# Patient Record
Sex: Female | Born: 1970 | Race: Black or African American | Hispanic: No | Marital: Married | Smoking: Never smoker
Health system: Southern US, Community
[De-identification: ages and names within clinical notes are randomized; demographics above are authoritative.]

## PROBLEM LIST (undated history)

## (undated) DIAGNOSIS — E039 Hypothyroidism, unspecified: Secondary | ICD-10-CM

## (undated) DIAGNOSIS — E079 Disorder of thyroid, unspecified: Secondary | ICD-10-CM

## (undated) DIAGNOSIS — F32A Depression, unspecified: Secondary | ICD-10-CM

## (undated) DIAGNOSIS — I10 Essential (primary) hypertension: Secondary | ICD-10-CM

## (undated) DIAGNOSIS — F329 Major depressive disorder, single episode, unspecified: Secondary | ICD-10-CM

## (undated) DIAGNOSIS — D649 Anemia, unspecified: Secondary | ICD-10-CM

## (undated) HISTORY — DX: Hypothyroidism, unspecified: E03.9

## (undated) HISTORY — PX: OTHER SURGICAL HISTORY: SHX169

## (undated) HISTORY — PX: TUBAL LIGATION: SHX77

---

## 2012-10-09 ENCOUNTER — Emergency Department (HOSPITAL_COMMUNITY)
Admission: EM | Admit: 2012-10-09 | Discharge: 2012-10-09 | Disposition: A | Discharge status: Home / Self Care | Payer: 59 | Attending: Emergency Medicine | Admitting: Emergency Medicine

## 2012-10-09 ENCOUNTER — Encounter (HOSPITAL_COMMUNITY): Payer: Self-pay

## 2012-10-09 ENCOUNTER — Emergency Department (HOSPITAL_COMMUNITY): Payer: 59

## 2012-10-09 DIAGNOSIS — R1013 Epigastric pain: Secondary | ICD-10-CM | POA: Insufficient documentation

## 2012-10-09 DIAGNOSIS — E079 Disorder of thyroid, unspecified: Secondary | ICD-10-CM | POA: Insufficient documentation

## 2012-10-09 DIAGNOSIS — R42 Dizziness and giddiness: Secondary | ICD-10-CM | POA: Insufficient documentation

## 2012-10-09 DIAGNOSIS — R197 Diarrhea, unspecified: Secondary | ICD-10-CM | POA: Insufficient documentation

## 2012-10-09 DIAGNOSIS — Z862 Personal history of diseases of the blood and blood-forming organs and certain disorders involving the immune mechanism: Secondary | ICD-10-CM | POA: Insufficient documentation

## 2012-10-09 DIAGNOSIS — R109 Unspecified abdominal pain: Secondary | ICD-10-CM

## 2012-10-09 DIAGNOSIS — R112 Nausea with vomiting, unspecified: Secondary | ICD-10-CM | POA: Insufficient documentation

## 2012-10-09 DIAGNOSIS — K529 Noninfective gastroenteritis and colitis, unspecified: Secondary | ICD-10-CM

## 2012-10-09 DIAGNOSIS — F329 Major depressive disorder, single episode, unspecified: Secondary | ICD-10-CM | POA: Insufficient documentation

## 2012-10-09 DIAGNOSIS — F3289 Other specified depressive episodes: Secondary | ICD-10-CM | POA: Insufficient documentation

## 2012-10-09 DIAGNOSIS — K5289 Other specified noninfective gastroenteritis and colitis: Secondary | ICD-10-CM | POA: Insufficient documentation

## 2012-10-09 DIAGNOSIS — Z79899 Other long term (current) drug therapy: Secondary | ICD-10-CM | POA: Insufficient documentation

## 2012-10-09 DIAGNOSIS — K429 Umbilical hernia without obstruction or gangrene: Secondary | ICD-10-CM | POA: Insufficient documentation

## 2012-10-09 DIAGNOSIS — R61 Generalized hyperhidrosis: Secondary | ICD-10-CM | POA: Insufficient documentation

## 2012-10-09 DIAGNOSIS — I10 Essential (primary) hypertension: Secondary | ICD-10-CM | POA: Insufficient documentation

## 2012-10-09 HISTORY — DX: Depression, unspecified: F32.A

## 2012-10-09 HISTORY — DX: Essential (primary) hypertension: I10

## 2012-10-09 HISTORY — DX: Major depressive disorder, single episode, unspecified: F32.9

## 2012-10-09 HISTORY — DX: Disorder of thyroid, unspecified: E07.9

## 2012-10-09 HISTORY — DX: Anemia, unspecified: D64.9

## 2012-10-09 LAB — HEPATIC FUNCTION PANEL
ALT: 15 U/L (ref 0–35)
AST: 19 U/L (ref 0–37)
Bilirubin, Direct: 0.1 mg/dL (ref 0.0–0.3)

## 2012-10-09 LAB — BASIC METABOLIC PANEL
BUN: 12 mg/dL (ref 6–23)
Chloride: 98 mEq/L (ref 96–112)
GFR calc Af Amer: >90 mL/min (ref >90)
Glucose, Bld: 113 mg/dL — ABNORMAL HIGH (ref 70–99)
Potassium: 4.3 mEq/L (ref 3.5–5.1)

## 2012-10-09 LAB — CBC WITH DIFFERENTIAL/PLATELET
Basophils Relative: 0 % (ref 0–1)
Hemoglobin: 14.1 g/dL (ref 12.0–15.0)
Lymphs Abs: 0.6 10*3/uL — ABNORMAL LOW (ref 0.7–4.0)
Monocytes Relative: 3 % (ref 3–12)
Neutro Abs: 7.3 10*3/uL (ref 1.7–7.7)
Neutrophils Relative %: 89 % — ABNORMAL HIGH (ref 43–77)
RBC: 5.17 MIL/uL — ABNORMAL HIGH (ref 3.87–5.11)

## 2012-10-09 LAB — URINALYSIS, ROUTINE W REFLEX MICROSCOPIC
Glucose, UA: NEGATIVE mg/dL
Hgb urine dipstick: NEGATIVE
Specific Gravity, Urine: 1.02 (ref 1.005–1.030)

## 2012-10-09 LAB — URINE MICROSCOPIC-ADD ON

## 2012-10-09 MED ORDER — PROMETHAZINE HCL 25 MG PO TABS: 25.0000 mg | ORAL_TABLET | Freq: Four times a day (QID) | ORAL | Status: DC | PRN

## 2012-10-09 MED ORDER — IOHEXOL 300 MG/ML  SOLN
100.0000 mL | Freq: Once | INTRAMUSCULAR | Status: AC | PRN
  Administered 2012-10-09: 100 mL via INTRAVENOUS

## 2012-10-09 MED ORDER — SODIUM CHLORIDE 0.9 % IV BOLUS (SEPSIS)
1000.0000 mL | Freq: Once | INTRAVENOUS | Status: AC
  Administered 2012-10-09: 1000 mL via INTRAVENOUS

## 2012-10-09 MED ORDER — HYDROMORPHONE HCL PF 1 MG/ML IJ SOLN
1.0000 mg | Freq: Once | INTRAMUSCULAR | Status: AC
  Administered 2012-10-09: 1 mg via INTRAVENOUS
  Filled 2012-10-09: qty 1

## 2012-10-09 MED ORDER — HYDROCODONE-ACETAMINOPHEN 5-325 MG PO TABS: 1.0000 | ORAL_TABLET | Freq: Four times a day (QID) | ORAL | Status: DC | PRN

## 2012-10-09 MED ORDER — SODIUM CHLORIDE 0.9 % IV SOLN
INTRAVENOUS | Status: DC
  Administered 2012-10-09: 09:00:00 via INTRAVENOUS

## 2012-10-09 MED ORDER — ONDANSETRON HCL 4 MG/2ML IJ SOLN
4.0000 mg | Freq: Once | INTRAMUSCULAR | Status: AC
Start: 2012-10-09 — End: 2012-10-09
  Administered 2012-10-09: 4 mg via INTRAVENOUS
  Filled 2012-10-09: qty 2

## 2012-10-09 MED ORDER — IOHEXOL 300 MG/ML  SOLN
50.0000 mL | Freq: Once | INTRAMUSCULAR | Status: AC | PRN
  Administered 2012-10-09: 50 mL via ORAL

## 2012-10-09 NOTE — ED Notes (Signed)
Complain of n/v/d since last night 

## 2012-10-09 NOTE — ED Provider Notes (Signed)
History    This chart was scribed for Karen Jakes, MD by Quintella Reichert, ED scribe.  This patient was seen in room [facility]03/[facility]03 and the patient's care was started at 8:36 AM .   CSN: [id number]  Arrival date & time 10/09/12  0759      Chief Complaint  Patient presents with  . Abdominal Pain     Patient is a 42 y.o. female presenting with abdominal pain. The history is provided by the patient. No language interpreter was used.  Abdominal Pain Pain location:  Periumbilical Pain quality: sharp   Pain radiates to:  L flank, R flank and groin Pain severity:  Severe Onset quality:  Sudden Duration:  10 hours Timing:  Constant Progression:  Unable to specify Chronicity:  New Relieved by:  None tried Worsened by:  Nothing tried Ineffective treatments:  None tried Associated symptoms: diarrhea, nausea and vomiting (no hematemesis)   Associated symptoms: no chest pain, no chills, no cough, no dysuria, no fever and no sore throat     HPI Comments: Karen Cruz is a 42 y.o. female who presents to the Emergency Department complaining of severe, constant, sudden-onset lower abdominal pain that radiates into both flanks and down into groin.  Symptoms began at 11 PM last night.  Pain is described as "sharp" and worst in periumbilical region. Patient rates pain 8/10 presently and 10/10 at worst.  Pt also reports nausea, intermittent emesis, mild diarrhea, lightheadedness, mild dizziness, and diaphoresis.  She has history of intermittent lower abdominal pain associated with menstrual periods, but she describes current pain as disinct from past episodes.  Pt denies back pain, dysuria, fever, chills, cough, rhinorrhea, sore throat, CP, SOB, hematemesis, swelling in legs, rash, neck pain, headache.  She denies sick contact. Pt has had treatment for an umbilical hernia, and has no h/o abdominal surgery.     Past Medical History  Diagnosis Date  . Anemia   . Hypertension   . Thyroid  disease   . Depression     Past Surgical History  Procedure Laterality Date  . Tubal ligation      No family history on file.  History  Substance Use Topics  . Smoking status: Not on file  . Smokeless tobacco: Not on file  . Alcohol Use: No    OB History   Grav Para Term Preterm Abortions TAB SAB Ect Mult Living                  Review of Systems  Constitutional: Positive for diaphoresis. Negative for fever and chills.  HENT: Negative for sore throat, rhinorrhea and neck pain.   Respiratory: Negative for cough.   Cardiovascular: Negative for chest pain and leg swelling.  Gastrointestinal: Positive for nausea, vomiting (no hematemesis), abdominal pain and diarrhea.  Genitourinary: Negative for dysuria.  Musculoskeletal: Negative for back pain.  Skin: Negative for rash.  Neurological: Positive for dizziness and light-headedness. Negative for headaches.  Hematological: Does not bruise/bleed easily.     Allergies  Review of patient's allergies indicates no known allergies.  Home Medications   Current Outpatient Rx  Name  Route  Sig  Dispense  Refill  . ALPRAZolam (XANAX) 0.5 MG tablet   Oral   Take 0.25 mg by mouth at bedtime as needed for sleep.         . hydrochlorothiazide (HYDRODIURIL) 25 MG tablet   Oral   Take 25 mg by mouth daily.         Marland Kitchen  Levomilnacipran HCl ER (FETZIMA) 20 MG CP24   Oral   Take 40 mg by mouth daily.         Marland Kitchen levothyroxine (SYNTHROID, LEVOTHROID) 175 MCG tablet   Oral   Take 175 mcg by mouth daily before breakfast.         . lisdexamfetamine (VYVANSE) 50 MG capsule   Oral   Take 50 mg by mouth every morning.         . nebivolol (BYSTOLIC) 10 MG tablet   Oral   Take 10 mg by mouth daily.         . Vitamin D, Ergocalciferol, (DRISDOL) 50000 UNITS CAPS   Oral   Take 50,000 Units by mouth every 7 (seven) days. Takes on Monday.         Marland Kitchen HYDROcodone-acetaminophen (NORCO/VICODIN) 5-325 MG per tablet   Oral    Take 1-2 tablets by mouth every 6 (six) hours as needed for pain.   10 tablet   0   . promethazine (PHENERGAN) 25 MG tablet   Oral   Take 1 tablet (25 mg total) by mouth every 6 (six) hours as needed for nausea.   12 tablet   0     Triage Vitals: BP 134/85  Pulse 83  Temp(Src) 97.7 F (36.5 C) (Oral)  Resp 16  Ht 5\' 1"  (1.549 m)  Wt 200 lb (90.719 kg)  BMI 37.81 kg/m2  SpO2 98%  Physical Exam  Nursing note and vitals reviewed. Constitutional: She is oriented to person, place, and time. She appears well-developed and well-nourished.  HENT:  Head: Normocephalic and atraumatic.  Eyes: Conjunctivae and EOM are normal. Pupils are equal, round, and reactive to light.  Neck: Normal range of motion. Neck supple.  Cardiovascular: Normal rate, regular rhythm and normal heart sounds.   No murmur heard. Pulmonary/Chest: Breath sounds normal. No respiratory distress. She has no wheezes. She has no rales.  Abdominal: Soft. Bowel sounds are normal. There is tenderness (Epigastric). There is no rebound and no guarding.  Musculoskeletal: Normal range of motion. She exhibits no edema.  Neurological: She is alert and oriented to person, place, and time. No cranial nerve deficit.  Skin: Skin is warm.     ED Course  Procedures (including critical care time)  DIAGNOSTIC STUDIES: Oxygen Saturation is 98% on room air, normal by my interpretation.    COORDINATION OF CARE: 8:40 AM-Discussed possibility of gastroenteritis, and treatment plan which includes anti-emetics, pain medication, IV fluids, labs and imaging with pt at bedside and pt agreed to plan.    Medications  0.9 %  sodium chloride infusion ( Intravenous New Bag/Given 10/09/12 0907)  ondansetron (ZOFRAN) injection 4 mg (4 mg Intravenous Given 10/09/12 0907)  HYDROmorphone (DILAUDID) injection 1 mg (1 mg Intravenous Given 10/09/12 0907)  sodium chloride 0.9 % bolus 1,000 mL (1,000 mLs Intravenous New Bag/Given 10/09/12 0913)   iohexol (OMNIPAQUE) 300 MG/ML solution 50 mL (50 mLs Oral Contrast Given 10/09/12 0903)  iohexol (OMNIPAQUE) 300 MG/ML solution 100 mL (100 mLs Intravenous Contrast Given 10/09/12 1107)    Labs Reviewed  URINALYSIS, ROUTINE W REFLEX MICROSCOPIC - Abnormal; Notable for the following:    Ketones, ur 40 (*)    Protein, ur TRACE (*)    All other components within normal limits  URINE MICROSCOPIC-ADD ON - Abnormal; Notable for the following:    Squamous Epithelial / LPF MANY (*)    Bacteria, UA FEW (*)    All other components within normal limits  CBC WITH DIFFERENTIAL - Abnormal; Notable for the following:    RBC 5.17 (*)    Neutrophils Relative 89 (*)    Lymphocytes Relative 8 (*)    Lymphs Abs 0.6 (*)    All other components within normal limits  BASIC METABOLIC PANEL - Abnormal; Notable for the following:    Glucose, Bld 113 (*)    All other components within normal limits  HEPATIC FUNCTION PANEL - Abnormal; Notable for the following:    Total Protein 9.1 (*)    All other components within normal limits  LIPASE, BLOOD    Ct Abdomen Pelvis W Contrast  10/09/2012  *RADIOLOGY REPORT*  Clinical Data: Sharp umbilical pain for 1 day.  History of tubal ligation.  CT ABDOMEN AND PELVIS WITH CONTRAST  Technique:  Multidetector CT imaging of the abdomen and pelvis was performed following the standard protocol during bolus administration of intravenous contrast.  Contrast: OMNIPAQUE IOHEXOL 300 MG/ML  SOLN  Comparison: None.  Findings: The lung bases are clear and there is no pleural effusion.  The liver, gallbladder, biliary system and pancreas appear normal. The spleen, adrenal glands and kidneys appear normal.  There is a small hiatal hernia.  The stomach and small bowel otherwise appear normal.  The ligament of Treitz is normally positioned.  The appendix and colon appear normal.  There is slight whirling of the small bowel mesentery on the axial images.  There is no volvulus or specific  evidence of internal hernia.  The uterus is retroverted.  There is a possible 9 mm fibroid in the left uterine fundus.  No adnexal mass is demonstrated.  There is a small amount of free pelvic fluid which is asymmetric to the right. There is no surrounding inflammatory change.  There is a supraumbilical ventral hernia which contains fat.  This measures 8.6 cm cephalocaudad.  There is an increased density along the inferior aspect of this hernia which could indicate incarceration.  There is no herniated bowel.  No other hernias are identified.  There is a convex left lumbar scoliosis without acute osseous abnormality.  IMPRESSION:  1.  Supraumbilical hernia containing fat.  There is increased density along the inferior aspect of the herniated fat which could indicate incarceration.  Correlate clinically.  There is no evidence of herniated bowel or bowel obstruction. 2.  Slight whirling appearance of the central small bowel mesentery without evidence of malrotation, volvulus or inflammation. 3.  Asymmetric free pelvic fluid on the right and possible small uterine fibroid. 4.  No evidence of appendicitis.   Original Report Authenticated By: Carey Bullocks, M.D.    Results for orders placed during the hospital encounter of 10/09/12  URINALYSIS, ROUTINE W REFLEX MICROSCOPIC      Result Value Range   Color, Urine YELLOW  YELLOW   APPearance CLEAR  CLEAR   Specific Gravity, Urine 1.020  1.005 - 1.030   pH 8.0  5.0 - 8.0   Glucose, UA NEGATIVE  NEGATIVE mg/dL   Hgb urine dipstick NEGATIVE  NEGATIVE   Bilirubin Urine NEGATIVE  NEGATIVE   Ketones, ur 40 (*) NEGATIVE mg/dL   Protein, ur TRACE (*) NEGATIVE mg/dL   Urobilinogen, UA 1.0  0.0 - 1.0 mg/dL   Nitrite NEGATIVE  NEGATIVE   Leukocytes, UA NEGATIVE  NEGATIVE  URINE MICROSCOPIC-ADD ON      Result Value Range   Squamous Epithelial / LPF MANY (*) RARE   Bacteria, UA FEW (*) RARE  CBC WITH DIFFERENTIAL  Result Value Range   WBC 8.2  4.0 - 10.5 K/uL    RBC 5.17 (*) 3.87 - 5.11 MIL/uL   Hemoglobin 14.1  12.0 - 15.0 g/dL   HCT 13.2  44.0 - 10.2 %   MCV 82.2  78.0 - 100.0 fL   MCH 27.3  26.0 - 34.0 pg   MCHC 33.2  30.0 - 36.0 g/dL   RDW 72.5  36.6 - 44.0 %   Platelets 261  150 - 400 K/uL   Neutrophils Relative 89 (*) 43 - 77 %   Neutro Abs 7.3  1.7 - 7.7 K/uL   Lymphocytes Relative 8 (*) 12 - 46 %   Lymphs Abs 0.6 (*) 0.7 - 4.0 K/uL   Monocytes Relative 3  3 - 12 %   Monocytes Absolute 0.3  0.1 - 1.0 K/uL   Eosinophils Relative 0  0 - 5 %   Eosinophils Absolute 0.0  0.0 - 0.7 K/uL   Basophils Relative 0  0 - 1 %   Basophils Absolute 0.0  0.0 - 0.1 K/uL  BASIC METABOLIC PANEL      Result Value Range   Sodium 138  135 - 145 mEq/L   Potassium 4.3  3.5 - 5.1 mEq/L   Chloride 98  96 - 112 mEq/L   CO2 29  19 - 32 mEq/L   Glucose, Bld 113 (*) 70 - 99 mg/dL   BUN 12  6 - 23 mg/dL   Creatinine, Ser 3.47  0.50 - 1.10 mg/dL   Calcium 42.5  8.4 - 95.6 mg/dL   GFR calc non Af Amer >90  >90 mL/min   GFR calc Af Amer >90  >90 mL/min  HEPATIC FUNCTION PANEL      Result Value Range   Total Protein 9.1 (*) 6.0 - 8.3 g/dL   Albumin 4.6  3.5 - 5.2 g/dL   AST 19  0 - 37 U/L   ALT 15  0 - 35 U/L   Alkaline Phosphatase 74  39 - 117 U/L   Total Bilirubin 0.7  0.3 - 1.2 mg/dL   Bilirubin, Direct 0.1  0.0 - 0.3 mg/dL   Indirect Bilirubin 0.6  0.3 - 0.9 mg/dL  LIPASE, BLOOD      Result Value Range   Lipase 16  11 - 59 U/L      1. Abdominal pain   2. Enteritis   3. Umbilical hernia       MDM   Patient with billable hernia containing some fat. No evidence of bowel incarceration. The fat could be causing the pain but is in no acute surgical intervention required for that. Other symptoms do seem to be consistent with a viral of gastroenteritis-type picture. Patient improved in the emergency department. We'll discharge on pain medicine and antinausea medicine and surgical referral for elective repair of the umbilical hernia. Patient will  return for any new or worse symptoms.    I personally performed the services described in this documentation, which was scribed in my presence. The recorded information has been reviewed and is accurate.    Karen Jakes, MD 10/09/12 [phone]

## 2012-10-09 NOTE — ED Notes (Signed)
Dr.zackowski to see pt.  

## 2012-10-09 NOTE — ED Notes (Signed)
RN at bedside

## 2012-10-09 NOTE — ED Notes (Signed)
Pt had incont. Stool on self, linens changed and pt given supplies to clean self in bathroom, walked without difficulty.

## 2012-10-09 NOTE — ED Notes (Signed)
Pt reports ab pain off/on for the last year, since having ablation.  Assisted out of bed by female friend, stated that ab pain was causing her legs to hurt.  +n/v/d that started last night. No fever. Given warm blanket at arrival, also has blanket from home.  Ambulated to bathroom without difficulty.

## 2015-03-23 ENCOUNTER — Ambulatory Visit (HOSPITAL_COMMUNITY)
Admission: RE | Admit: 2015-03-23 | Discharge: 2015-03-23 | Disposition: A | Discharge status: Home / Self Care | Payer: No Typology Code available for payment source | Source: Ambulatory Visit | Attending: Pulmonary Disease | Admitting: Pulmonary Disease

## 2015-03-23 ENCOUNTER — Other Ambulatory Visit (HOSPITAL_COMMUNITY): Payer: Self-pay | Admitting: Pulmonary Disease

## 2015-03-23 ENCOUNTER — Encounter (HOSPITAL_COMMUNITY): Payer: Self-pay

## 2015-03-23 ENCOUNTER — Encounter (HOSPITAL_COMMUNITY)
Admission: RE | Admit: 2015-03-23 | Discharge: 2015-03-23 | Disposition: A | Discharge status: Home / Self Care | Payer: No Typology Code available for payment source | Source: Ambulatory Visit | Attending: Pulmonary Disease | Admitting: Pulmonary Disease

## 2015-03-23 DIAGNOSIS — R791 Abnormal coagulation profile: Secondary | ICD-10-CM | POA: Diagnosis not present

## 2015-03-23 DIAGNOSIS — R079 Chest pain, unspecified: Secondary | ICD-10-CM | POA: Insufficient documentation

## 2015-03-23 DIAGNOSIS — R7989 Other specified abnormal findings of blood chemistry: Secondary | ICD-10-CM

## 2015-03-23 DIAGNOSIS — R0602 Shortness of breath: Secondary | ICD-10-CM | POA: Diagnosis present

## 2015-03-23 DIAGNOSIS — I2699 Other pulmonary embolism without acute cor pulmonale: Secondary | ICD-10-CM

## 2015-03-23 MED ORDER — TECHNETIUM TO 99M ALBUMIN AGGREGATED
4.0000 | Freq: Once | INTRAVENOUS | Status: AC | PRN
  Administered 2015-03-23: 3.97 via INTRAVENOUS

## 2015-03-23 MED ORDER — TECHNETIUM TC 99M DIETHYLENETRIAME-PENTAACETIC ACID
30.0000 | Freq: Once | INTRAVENOUS | Status: DC | PRN
  Administered 2015-03-23: 30.2 via RESPIRATORY_TRACT
  Filled 2015-03-23: qty 30

## 2016-07-20 ENCOUNTER — Emergency Department (HOSPITAL_COMMUNITY)
Admission: EM | Admit: 2016-07-20 | Discharge: 2016-07-21 | Disposition: A | Discharge status: Home / Self Care | Payer: BLUE CROSS/BLUE SHIELD | Attending: Emergency Medicine | Admitting: Emergency Medicine

## 2016-07-20 ENCOUNTER — Encounter (HOSPITAL_COMMUNITY): Payer: Self-pay

## 2016-07-20 ENCOUNTER — Emergency Department (HOSPITAL_COMMUNITY): Payer: BLUE CROSS/BLUE SHIELD

## 2016-07-20 DIAGNOSIS — R079 Chest pain, unspecified: Secondary | ICD-10-CM | POA: Diagnosis not present

## 2016-07-20 DIAGNOSIS — Z79899 Other long term (current) drug therapy: Secondary | ICD-10-CM | POA: Diagnosis not present

## 2016-07-20 DIAGNOSIS — I10 Essential (primary) hypertension: Secondary | ICD-10-CM | POA: Insufficient documentation

## 2016-07-20 DIAGNOSIS — R0789 Other chest pain: Secondary | ICD-10-CM

## 2016-07-20 MED ORDER — ASPIRIN 81 MG PO CHEW
324.0000 mg | CHEWABLE_TABLET | Freq: Once | ORAL | Status: AC
  Administered 2016-07-21: 324 mg via ORAL
  Filled 2016-07-20: qty 4

## 2016-07-20 MED ORDER — NITROGLYCERIN 0.4 MG SL SUBL
0.4000 mg | SUBLINGUAL_TABLET | Freq: Once | SUBLINGUAL | Status: AC
  Administered 2016-07-21: 0.4 mg via SUBLINGUAL
  Filled 2016-07-20: qty 1

## 2016-07-20 NOTE — ED Provider Notes (Addendum)
[facility] DEPT Provider Note   CSN: [id number] Arrival date & time: 07/20/16  2330 By signing my name below, I, Dyke Brackett, attest that this documentation has been prepared under the direction and in the presence of Orpah Greek, MD . Electronically Signed: Dyke Brackett, Scribe. 07/20/2016. 11:52 PM.   History   Chief Complaint Chief Complaint  Patient presents with  . Chest Pain    HPI Karen Cruz is a 46 y.o. female with a PMHx of HTN who presents to the Emergency Department complaining of sudden onset, constant central chest pain onset one hour ago. She has never had this pain before. Pt is a nonsmoker. No family PMHx of MI. Pt describes her pain as heaviness and states the pain radiates to her left arm.  She notes associated SOB exacerbated by movement and exertion.  No hx of DM.   The history is provided by the patient. No language interpreter was used.    Past Medical History:  Diagnosis Date  . Anemia   . Depression   . Hypertension   . Thyroid disease     There are no active problems to display for this patient.   Past Surgical History:  Procedure Laterality Date  . TUBAL LIGATION      OB History    No data available      Home Medications    Prior to Admission medications   Medication Sig Start Date End Date Taking? Authorizing Provider  ALPRAZolam Duanne Moron) 0.5 MG tablet Take 0.25 mg by mouth at bedtime as needed for sleep.    Historical Provider, MD  hydrochlorothiazide (HYDRODIURIL) 25 MG tablet Take 25 mg by mouth daily.    Historical Provider, MD  HYDROcodone-acetaminophen (NORCO/VICODIN) 5-325 MG per tablet Take 1-2 tablets by mouth every 6 (six) hours as needed for pain. 10/09/12   Fredia Sorrow, MD  Levomilnacipran HCl ER (FETZIMA) 20 MG CP24 Take 40 mg by mouth daily.    Historical Provider, MD  levothyroxine (SYNTHROID, LEVOTHROID) 175 MCG tablet Take 175 mcg by mouth daily before breakfast.    Historical Provider, MD    lisdexamfetamine (VYVANSE) 50 MG capsule Take 50 mg by mouth every morning.    Historical Provider, MD  nebivolol (BYSTOLIC) 10 MG tablet Take 10 mg by mouth daily.    Historical Provider, MD  promethazine (PHENERGAN) 25 MG tablet Take 1 tablet (25 mg total) by mouth every 6 (six) hours as needed for nausea. 10/09/12   Fredia Sorrow, MD  Vitamin D, Ergocalciferol, (DRISDOL) 50000 UNITS CAPS Take 50,000 Units by mouth every 7 (seven) days. Takes on Monday.    Historical Provider, MD    Family History History reviewed. No pertinent family history.  Social History Social History  Substance Use Topics  . Smoking status: Never Smoker  . Smokeless tobacco: Never Used  . Alcohol use No     Allergies   Patient has no known allergies.   Review of Systems Review of Systems 10 systems reviewed and all are negative for acute change except as noted in the HPI.   Physical Exam Updated Vital Signs BP 101/60   Pulse 77   Temp 97.9 F (36.6 C) (Oral)   Resp 14   Ht 5\' 1"  (1.549 m)   Wt 225 lb (102.1 kg)   SpO2 96%   BMI 42.51 kg/m   Physical Exam  Constitutional: She is oriented to person, place, and time. She appears well-developed and well-nourished. No distress.  HENT:  Head: Normocephalic  and atraumatic.  Right Ear: Hearing normal.  Left Ear: Hearing normal.  Nose: Nose normal.  Mouth/Throat: Oropharynx is clear and moist and mucous membranes are normal.  Eyes: Conjunctivae and EOM are normal. Pupils are equal, round, and reactive to light.  Neck: Normal range of motion. Neck supple.  Cardiovascular: Regular rhythm, S1 normal and S2 normal.  Exam reveals no gallop and no friction rub.   No murmur heard. Pulmonary/Chest: Effort normal and breath sounds normal. No respiratory distress. She exhibits tenderness (anterior).  Abdominal: Soft. Normal appearance and bowel sounds are normal. There is no hepatosplenomegaly. There is no tenderness. There is no rebound, no guarding, no  tenderness at McBurney's point and negative Murphy's sign. No hernia.  Musculoskeletal: Normal range of motion.  Neurological: She is alert and oriented to person, place, and time. She has normal strength. No cranial nerve deficit or sensory deficit. Coordination normal. GCS eye subscore is 4. GCS verbal subscore is 5. GCS motor subscore is 6.  Skin: Skin is warm, dry and intact. No rash noted. No cyanosis.  Psychiatric: She has a normal mood and affect. Her speech is normal and behavior is normal. Thought content normal.  Nursing note and vitals reviewed.  ED Treatments / Results  DIAGNOSTIC STUDIES:  Oxygen Saturation is 99% on room air, normal by my interpretation.    COORDINATION OF CARE:  11:41 PM Discussed treatment plan with pt at bedside and pt agreed to plan.  Labs (all labs ordered are listed, but only abnormal results are displayed) Labs Reviewed  CBC WITH DIFFERENTIAL/PLATELET - Abnormal; Notable for the following:       Result Value   Hemoglobin 11.6 (*)    HCT 34.8 (*)    All other components within normal limits  COMPREHENSIVE METABOLIC PANEL - Abnormal; Notable for the following:    Calcium 8.6 (*)    All other components within normal limits  LIPASE, BLOOD  TROPONIN I  TROPONIN I    EKG  EKG Interpretation  Date/Time:  Friday July 20 2016 23:40:47 EST Ventricular Rate:  79 PR Interval:    QRS Duration: 92 QT Interval:  383 QTC Calculation: 439 R Axis:   43 Text Interpretation:  Sinus rhythm Baseline wander in lead(s) V1 Normal ECG Confirmed by Betsey Holiday  MD, Rossie Scarfone [phone]) on 07/21/2016 12:00:37 AM       Radiology Dg Chest 2 View  Result Date: 07/21/2016 CLINICAL DATA:  Acute onset of mid to left-sided chest pain, radiating down the left arm. Nausea. Productive cough. Initial encounter. EXAM: CHEST  2 VIEW COMPARISON:  Chest radiograph performed 03/23/2015 FINDINGS: The lungs are well-aerated and clear. There is no evidence of focal  opacification, pleural effusion or pneumothorax. The heart is normal in size; the mediastinal contour is within normal limits. No acute osseous abnormalities are seen. IMPRESSION: No acute cardiopulmonary process seen. Electronically Signed   By: Garald Balding M.D.   On: 07/21/2016 01:11    Procedures Procedures (including critical care time)  Medications Ordered in ED Medications  morphine 4 MG/ML injection 4 mg (4 mg Intravenous Not Given 07/21/16 0247)  ondansetron (ZOFRAN) injection 4 mg (4 mg Intravenous Not Given 07/21/16 0248)  aspirin chewable tablet 324 mg (324 mg Oral Given 07/21/16 0019)  nitroGLYCERIN (NITROSTAT) SL tablet 0.4 mg (0.4 mg Sublingual Given 07/21/16 0020)     Initial Impression / Assessment and Plan / ED Course  I have reviewed the triage vital signs and the nursing notes.  Pertinent labs &  imaging results that were available during my care of the patient were reviewed by me and considered in my medical decision making (see chart for details).     Patient presents to the emergency department for evaluation of chest pain. Patient reports onset of chest pain while at rest earlier tonight. Patient's only cardiac risk factors are obesity and hypertension. Her heart score is 2, felt to be very low risk for cardiac etiology. Her EKG is normal. Troponin was negative at arrival. This was repeated 3 hours and was still normal. Based on her low risk, it is felt that she is safe for discharge and follow-up with primary doctor. She also was PERC negative, very low risk for PE.  HEART Score for Major Cardiac Events from [email or web address]  on 07/21/2016 ** All calculations should be rechecked by clinician prior to use **  RESULT SUMMARY: 2 points Low Score (0-3 points)  Risk of MACE of 0.9-1.7%.   INPUTS: History -> 0 = Slightly suspicious EKG -> 0 = Normal Age -> 1 = 45-64 Risk factors -> 1 = 1-2 risk factors Initial troponin -> 0 = =normal limit  PERC Rule for Pulmonary  Embolism from [email or web address]  on 07/21/2016 ** All calculations should be rechecked by clinician prior to use **  RESULT SUMMARY: 0 criteria No need for further workup, as <2% chance of PE.  If no criteria are positive and clinician's pre-test probability is <15%, PERC Rule criteria are satisfied.   INPUTS: Age =50 -> 0 = No HR =100 -> 0 = No SaO2 on room air <95% -> 0 = No Unilateral leg swelling -> 0 = No Hemoptysis -> 0 = No Recent surgery or trauma -> 0 = No Prior PE or DVT -> 0 = No Hormone use -> 0 = No  Final Clinical Impressions(s) / ED Diagnoses   Final diagnoses:  Chest pain, unspecified type    New Prescriptions New Prescriptions   No medications on file  I personally performed the services described in this documentation, which was scribed in my presence. The recorded information has been reviewed and is accurate.    Orpah Greek, MD 07/21/16 [place], MD 07/21/16 [phone]

## 2016-07-20 NOTE — ED Triage Notes (Signed)
Pt complains of chest pain starting 45 minutes ago. Pt also complains of nausea and heaviness centrally with pain radiating to left arm.

## 2016-07-21 LAB — TROPONIN I: Troponin I: <0.03 ng/mL (ref <0.03)

## 2016-07-21 LAB — CBC WITH DIFFERENTIAL/PLATELET
BASOS ABS: 0 10*3/uL (ref 0.0–0.1)
BASOS PCT: 0 %
EOS ABS: 0.3 10*3/uL (ref 0.0–0.7)
Eosinophils Relative: 3 %
HEMATOCRIT: 34.8 % — AB (ref 36.0–46.0)
HEMOGLOBIN: 11.6 g/dL — AB (ref 12.0–15.0)
Lymphocytes Relative: 22 %
Lymphs Abs: 1.9 10*3/uL (ref 0.7–4.0)
MCH: 28.4 pg (ref 26.0–34.0)
MCHC: 33.3 g/dL (ref 30.0–36.0)
MCV: 85.1 fL (ref 78.0–100.0)
MONOS PCT: 5 %
Monocytes Absolute: 0.5 10*3/uL (ref 0.1–1.0)
NEUTROS ABS: 5.8 10*3/uL (ref 1.7–7.7)
NEUTROS PCT: 69 %
Platelets: 221 10*3/uL (ref 150–400)
RBC: 4.09 MIL/uL (ref 3.87–5.11)
RDW: 14.6 % (ref 11.5–15.5)
WBC: 8.4 10*3/uL (ref 4.0–10.5)

## 2016-07-21 LAB — COMPREHENSIVE METABOLIC PANEL
ALBUMIN: 4.1 g/dL (ref 3.5–5.0)
ALK PHOS: 51 U/L (ref 38–126)
ALT: 16 U/L (ref 14–54)
ANION GAP: 11 (ref 5–15)
AST: 18 U/L (ref 15–41)
BUN: 12 mg/dL (ref 6–20)
CALCIUM: 8.6 mg/dL — AB (ref 8.9–10.3)
CO2: 24 mmol/L (ref 22–32)
Chloride: 103 mmol/L (ref 101–111)
Creatinine, Ser: 0.8 mg/dL (ref 0.44–1.00)
GFR calc Af Amer: >60 mL/min (ref >60)
GFR calc non Af Amer: >60 mL/min (ref >60)
GLUCOSE: 94 mg/dL (ref 65–99)
Potassium: 3.7 mmol/L (ref 3.5–5.1)
SODIUM: 138 mmol/L (ref 135–145)
Total Bilirubin: 0.6 mg/dL (ref 0.3–1.2)
Total Protein: 7.7 g/dL (ref 6.5–8.1)

## 2016-07-21 LAB — LIPASE, BLOOD: Lipase: 21 U/L (ref 11–51)

## 2016-07-21 MED ORDER — MORPHINE SULFATE (PF) 4 MG/ML IV SOLN
4.0000 mg | Freq: Once | INTRAVENOUS | Status: DC
  Filled 2016-07-21: qty 1

## 2016-07-21 MED ORDER — ONDANSETRON HCL 4 MG/2ML IJ SOLN
4.0000 mg | Freq: Once | INTRAMUSCULAR | Status: DC
  Filled 2016-07-21: qty 2

## 2017-02-11 ENCOUNTER — Other Ambulatory Visit: Payer: Self-pay | Admitting: Nurse Practitioner

## 2017-02-11 DIAGNOSIS — N63 Unspecified lump in unspecified breast: Secondary | ICD-10-CM

## 2017-02-13 ENCOUNTER — Other Ambulatory Visit: Payer: Self-pay | Admitting: Nurse Practitioner

## 2017-02-13 ENCOUNTER — Ambulatory Visit: Admission: RE | Admit: 2017-02-13 | Payer: No Typology Code available for payment source | Source: Ambulatory Visit

## 2017-02-13 ENCOUNTER — Ambulatory Visit
Admission: RE | Admit: 2017-02-13 | Discharge: 2017-02-13 | Disposition: A | Discharge status: Home / Self Care | Payer: BLUE CROSS/BLUE SHIELD | Source: Ambulatory Visit | Attending: Nurse Practitioner | Admitting: Nurse Practitioner

## 2017-02-13 DIAGNOSIS — N63 Unspecified lump in unspecified breast: Secondary | ICD-10-CM

## 2017-04-16 ENCOUNTER — Ambulatory Visit: Payer: BLUE CROSS/BLUE SHIELD

## 2017-04-16 ENCOUNTER — Other Ambulatory Visit: Payer: BLUE CROSS/BLUE SHIELD

## 2017-04-16 ENCOUNTER — Ambulatory Visit
Admission: RE | Admit: 2017-04-16 | Discharge: 2017-04-16 | Disposition: A | Discharge status: Home / Self Care | Payer: BLUE CROSS/BLUE SHIELD | Source: Ambulatory Visit | Attending: Nurse Practitioner | Admitting: Nurse Practitioner

## 2017-04-16 DIAGNOSIS — N63 Unspecified lump in unspecified breast: Secondary | ICD-10-CM

## 2017-12-31 ENCOUNTER — Emergency Department (HOSPITAL_COMMUNITY): Payer: 59

## 2017-12-31 ENCOUNTER — Emergency Department (HOSPITAL_COMMUNITY)
Admission: EM | Admit: 2017-12-31 | Discharge: 2017-12-31 | Disposition: A | Discharge status: Home / Self Care | Payer: 59 | Attending: Emergency Medicine | Admitting: Emergency Medicine

## 2017-12-31 ENCOUNTER — Encounter (HOSPITAL_COMMUNITY): Payer: Self-pay

## 2017-12-31 ENCOUNTER — Other Ambulatory Visit: Payer: Self-pay

## 2017-12-31 DIAGNOSIS — I10 Essential (primary) hypertension: Secondary | ICD-10-CM | POA: Insufficient documentation

## 2017-12-31 DIAGNOSIS — Z79899 Other long term (current) drug therapy: Secondary | ICD-10-CM | POA: Diagnosis not present

## 2017-12-31 DIAGNOSIS — F329 Major depressive disorder, single episode, unspecified: Secondary | ICD-10-CM | POA: Diagnosis not present

## 2017-12-31 DIAGNOSIS — R110 Nausea: Secondary | ICD-10-CM | POA: Insufficient documentation

## 2017-12-31 DIAGNOSIS — R1030 Lower abdominal pain, unspecified: Secondary | ICD-10-CM | POA: Diagnosis present

## 2017-12-31 LAB — URINALYSIS, ROUTINE W REFLEX MICROSCOPIC
Bilirubin Urine: NEGATIVE
GLUCOSE, UA: NEGATIVE mg/dL
HGB URINE DIPSTICK: NEGATIVE
KETONES UR: NEGATIVE mg/dL
Leukocytes, UA: NEGATIVE
Nitrite: NEGATIVE
PROTEIN: NEGATIVE mg/dL
Specific Gravity, Urine: 1.032 — ABNORMAL HIGH (ref 1.005–1.030)
pH: 7 (ref 5.0–8.0)

## 2017-12-31 LAB — CBC
HCT: 37.9 % (ref 36.0–46.0)
Hemoglobin: 12.3 g/dL (ref 12.0–15.0)
MCH: 27.6 pg (ref 26.0–34.0)
MCHC: 32.5 g/dL (ref 30.0–36.0)
MCV: 85 fL (ref 78.0–100.0)
PLATELETS: 269 10*3/uL (ref 150–400)
RBC: 4.46 MIL/uL (ref 3.87–5.11)
RDW: 14.5 % (ref 11.5–15.5)
WBC: 6.9 10*3/uL (ref 4.0–10.5)

## 2017-12-31 LAB — COMPREHENSIVE METABOLIC PANEL
ALK PHOS: 54 U/L (ref 38–126)
ALT: 12 U/L (ref 0–44)
AST: 17 U/L (ref 15–41)
Albumin: 4.1 g/dL (ref 3.5–5.0)
Anion gap: 8 (ref 5–15)
BUN: 19 mg/dL (ref 6–20)
CHLORIDE: 104 mmol/L (ref 98–111)
CO2: 27 mmol/L (ref 22–32)
CREATININE: 1.08 mg/dL — AB (ref 0.44–1.00)
Calcium: 8.6 mg/dL — ABNORMAL LOW (ref 8.9–10.3)
GFR, EST NON AFRICAN AMERICAN: 60 mL/min — AB (ref >60)
Glucose, Bld: 98 mg/dL (ref 70–99)
Potassium: 3.6 mmol/L (ref 3.5–5.1)
Sodium: 139 mmol/L (ref 135–145)
Total Bilirubin: 0.5 mg/dL (ref 0.3–1.2)
Total Protein: 8.5 g/dL — ABNORMAL HIGH (ref 6.5–8.1)

## 2017-12-31 LAB — HCG, SERUM, QUALITATIVE: PREG SERUM: NEGATIVE

## 2017-12-31 LAB — LIPASE, BLOOD: LIPASE: 31 U/L (ref 11–51)

## 2017-12-31 MED ORDER — SODIUM CHLORIDE 0.9 % IV BOLUS
1000.0000 mL | Freq: Once | INTRAVENOUS | Status: AC
Start: 2017-12-31 — End: 2017-12-31
  Administered 2017-12-31: 1000 mL via INTRAVENOUS

## 2017-12-31 MED ORDER — IOPAMIDOL (ISOVUE-300) INJECTION 61%
100.0000 mL | Freq: Once | INTRAVENOUS | Status: AC | PRN
  Administered 2017-12-31: 100 mL via INTRAVENOUS

## 2017-12-31 MED ORDER — SODIUM CHLORIDE 0.9 % IV BOLUS
500.0000 mL | Freq: Once | INTRAVENOUS | Status: AC
  Administered 2017-12-31: 500 mL via INTRAVENOUS

## 2017-12-31 MED ORDER — ONDANSETRON HCL 4 MG PO TABS: 4.0000 mg | ORAL_TABLET | Freq: Three times a day (TID) | ORAL | 0 refills | Status: DC | PRN

## 2017-12-31 MED ORDER — FENTANYL CITRATE (PF) 100 MCG/2ML IJ SOLN
50.0000 ug | Freq: Once | INTRAMUSCULAR | Status: AC
  Administered 2017-12-31: 50 ug via INTRAVENOUS
  Filled 2017-12-31: qty 2

## 2017-12-31 MED ORDER — ONDANSETRON HCL 4 MG/2ML IJ SOLN
4.0000 mg | Freq: Once | INTRAMUSCULAR | Status: AC
  Administered 2017-12-31: 4 mg via INTRAVENOUS
  Filled 2017-12-31: qty 2

## 2017-12-31 MED ORDER — KETOROLAC TROMETHAMINE 30 MG/ML IJ SOLN: 30.0000 mg | Freq: Once | INTRAMUSCULAR | Status: DC

## 2017-12-31 MED ORDER — KETOROLAC TROMETHAMINE 30 MG/ML IJ SOLN
15.0000 mg | Freq: Once | INTRAMUSCULAR | Status: AC
  Administered 2017-12-31: 15 mg via INTRAVENOUS
  Filled 2017-12-31: qty 1

## 2017-12-31 MED ORDER — NAPROXEN 500 MG PO TABS: ORAL_TABLET | ORAL | 0 refills | Status: DC

## 2017-12-31 NOTE — ED Triage Notes (Signed)
Pt reports lower abd pain that radiates around to back and groin area x 2 days. Pt has had nausea, no vomiting, no diarrhea. Last BM was today and reported normal per pt.

## 2017-12-31 NOTE — ED Notes (Signed)
Patient given discharge instruction, verbalized understand. IV removed, band aid applied. Patient ambulatory out of the department.  

## 2017-12-31 NOTE — ED Notes (Signed)
ED Provider at bedside. 

## 2017-12-31 NOTE — ED Provider Notes (Signed)
[facility] EMERGENCY DEPARTMENT Provider Note   CSN: [id number] Arrival date & time: 12/31/17  6808  Time seen 05:18 AM   History   Chief Complaint Chief Complaint  Patient presents with  . Abdominal Pain    HPI Karen Cruz is a 47 y.o. female.  HPI patient starts out telling me she has been having this pain off and on for the past 6 years.  She states she has had ultrasounds, CT scan x2, had colonoscopy done by gastroenterologist, and was evaluated by a gynecologist who told her to take ibuprofen.  She states the pain is been worsening over the last 2 days.  The pain is in her lower abdomen bilaterally radiates into her lower back and into her thighs.  She states the pain is sharp and has been constant since 10 PM.  She states it was lasting for a few hours off and on before it became constant.  She states when it first started out she would have it around the time of her menses however she had a uterine ablation about 6 years ago and does not have a period anymore.  She states nothing makes the pain worse, ibuprofen used to help with the pain but not now.  She states she took ibuprofen tonight and half of the Norco without relief.  She has nausea without vomiting, no diarrhea, no constipation, and no urinary symptoms such as dysuria, frequency, or hematuria.  She states last time she had to be seen for the pain was a year ago.  However at the end of our interview she states his pain is totally different from what she used to have in the past, she states now it is a stabbing pain in the location is different.  PCP Guy Begin, FNP   Past Medical History:  Diagnosis Date  . Anemia   . Depression   . Hypertension   . Thyroid disease     There are no active problems to display for this patient.   Past Surgical History:  Procedure Laterality Date  . TUBAL LIGATION    . uterine ablasion       OB History   None      Home Medications    Prior to Admission medications    Medication Sig Start Date End Date Taking? Authorizing Provider  ALPRAZolam Duanne Moron) 0.5 MG tablet Take 0.25 mg by mouth at bedtime as needed for sleep.    [provider]  hydrochlorothiazide (HYDRODIURIL) 25 MG tablet Take 25 mg by mouth daily.    [provider]  HYDROcodone-acetaminophen (NORCO/VICODIN) 5-325 MG per tablet Take 1-2 tablets by mouth every 6 (six) hours as needed for pain. 10/09/12   Fredia Sorrow, MD  Levomilnacipran HCl ER (FETZIMA) 20 MG CP24 Take 40 mg by mouth daily.    [provider]  levothyroxine (SYNTHROID, LEVOTHROID) 175 MCG tablet Take 175 mcg by mouth daily before breakfast.    [provider]  lisdexamfetamine (VYVANSE) 50 MG capsule Take 50 mg by mouth every morning.    [provider]  naproxen (NAPROSYN) 500 MG tablet Take 1 po BID with food prn pain 12/31/17   Rolland Porter, MD  nebivolol (BYSTOLIC) 10 MG tablet Take 10 mg by mouth daily.    [provider]  ondansetron (ZOFRAN) 4 MG tablet Take 1 tablet (4 mg total) by mouth every 8 (eight) hours as needed for nausea or vomiting. 12/31/17   Rolland Porter, MD  promethazine (PHENERGAN) 25 MG  tablet Take 1 tablet (25 mg total) by mouth every 6 (six) hours as needed for nausea. 10/09/12   Fredia Sorrow, MD  Vitamin D, Ergocalciferol, (DRISDOL) 50000 UNITS CAPS Take 50,000 Units by mouth every 7 (seven) days. Takes on Monday.    [provider]    Family History No family history on file.  Social History Social History   Tobacco Use  . Smoking status: Never Smoker  . Smokeless tobacco: Never Used  Substance Use Topics  . Alcohol use: Yes    Comment: socially  . Drug use: No     Allergies   Patient has no known allergies.   Review of Systems Review of Systems  All other systems reviewed and are negative.    Physical Exam Updated Vital Signs BP 136/90   Pulse (!) 58   Temp 98.6 F (37 C) (Oral)   Resp 16   Ht 5\' 1"  (1.549 m)   Wt  104.3 kg (230 lb)   SpO2 100%   BMI 43.46 kg/m   Physical Exam  Constitutional: She is oriented to person, place, and time. She appears well-developed and well-nourished.  Non-toxic appearance. She does not appear ill. No distress.  HENT:  Head: Normocephalic and atraumatic.  Right Ear: External ear normal.  Left Ear: External ear normal.  Nose: Nose normal. No mucosal edema or rhinorrhea.  Mouth/Throat: Oropharynx is clear and moist and mucous membranes are normal. No dental abscesses or uvula swelling.  Eyes: Pupils are equal, round, and reactive to light. Conjunctivae and EOM are normal.  Neck: Normal range of motion and full passive range of motion without pain. Neck supple.  Cardiovascular: Normal rate, regular rhythm and normal heart sounds. Exam reveals no gallop and no friction rub.  No murmur heard. Pulmonary/Chest: Effort normal and breath sounds normal. No respiratory distress. She has no wheezes. She has no rhonchi. She has no rales. She exhibits no tenderness and no crepitus.  Abdominal: Soft. Normal appearance and bowel sounds are normal. She exhibits no distension. There is no tenderness. There is no rebound and no guarding.    Area of nontender fullness noted  Musculoskeletal: Normal range of motion. She exhibits no edema or tenderness.  Moves all extremities well.   Neurological: She is alert and oriented to person, place, and time. She has normal strength. No cranial nerve deficit.  Skin: Skin is warm, dry and intact. No rash noted. No erythema. No pallor.  Psychiatric: She has a normal mood and affect. Her speech is normal and behavior is normal. Her mood appears not anxious.  Nursing note and vitals reviewed.    ED Treatments / Results  Labs (all labs ordered are listed, but only abnormal results are displayed) Results for orders placed or performed during the hospital encounter of 12/31/17  Lipase, blood  Result Value Ref Range   Lipase 31 11 - 51 U/L    Comprehensive metabolic panel  Result Value Ref Range   Sodium 139 135 - 145 mmol/L   Potassium 3.6 3.5 - 5.1 mmol/L   Chloride 104 98 - 111 mmol/L   CO2 27 22 - 32 mmol/L   Glucose, Bld 98 70 - 99 mg/dL   BUN 19 6 - 20 mg/dL   Creatinine, Ser 1.08 (H) 0.44 - 1.00 mg/dL   Calcium 8.6 (L) 8.9 - 10.3 mg/dL   Total Protein 8.5 (H) 6.5 - 8.1 g/dL   Albumin 4.1 3.5 - 5.0 g/dL   AST 17 15 -  41 U/L   ALT 12 0 - 44 U/L   Alkaline Phosphatase 54 38 - 126 U/L   Total Bilirubin 0.5 0.3 - 1.2 mg/dL   GFR calc non Af Amer 60 (L) >60 mL/min   GFR calc Af Amer >60 >60 mL/min   Anion gap 8 5 - 15  CBC  Result Value Ref Range   WBC 6.9 4.0 - 10.5 K/uL   RBC 4.46 3.87 - 5.11 MIL/uL   Hemoglobin 12.3 12.0 - 15.0 g/dL   HCT 37.9 36.0 - 46.0 %   MCV 85.0 78.0 - 100.0 fL   MCH 27.6 26.0 - 34.0 pg   MCHC 32.5 30.0 - 36.0 g/dL   RDW 14.5 11.5 - 15.5 %   Platelets 269 150 - 400 K/uL  Urinalysis, Routine w reflex microscopic  Result Value Ref Range   Color, Urine STRAW (A) YELLOW   APPearance CLEAR CLEAR   Specific Gravity, Urine 1.032 (H) 1.005 - 1.030   pH 7.0 5.0 - 8.0   Glucose, UA NEGATIVE NEGATIVE mg/dL   Hgb urine dipstick NEGATIVE NEGATIVE   Bilirubin Urine NEGATIVE NEGATIVE   Ketones, ur NEGATIVE NEGATIVE mg/dL   Protein, ur NEGATIVE NEGATIVE mg/dL   Nitrite NEGATIVE NEGATIVE   Leukocytes, UA NEGATIVE NEGATIVE  hCG, serum, qualitative  Result Value Ref Range   Preg, Serum NEGATIVE NEGATIVE   Laboratory interpretation all normal except concentrated UA c/w dehydration    EKG None  Radiology Ct Abdomen Pelvis W Contrast  Result Date: 12/31/2017 CLINICAL DATA:  Acute lower abdominal pain. EXAM: CT ABDOMEN AND PELVIS WITH CONTRAST TECHNIQUE: Multidetector CT imaging of the abdomen and pelvis was performed using the standard protocol following bolus administration of intravenous contrast. CONTRAST:  126mL ISOVUE-300 IOPAMIDOL (ISOVUE-300) INJECTION 61% COMPARISON:  CT scan of October 09, 2012. FINDINGS: Lower chest: No acute abnormality. Hepatobiliary: No focal liver abnormality is seen. No gallstones, gallbladder wall thickening, or biliary dilatation. Pancreas: Unremarkable. No pancreatic ductal dilatation or surrounding inflammatory changes. Spleen: Normal in size without focal abnormality. Adrenals/Urinary Tract: Adrenal glands appear normal. Nonobstructive calculi are noted in lower pole collecting system of right kidney. No hydronephrosis or renal obstruction is noted. Urinary bladder is unremarkable. Stomach/Bowel: The stomach and appendix appear normal. Supraumbilical ventral hernia is noted which contains a portion of transverse colon, but does not result in obstruction. No significant inflammation is noted. Vascular/Lymphatic: No significant vascular findings are present. No enlarged abdominal or pelvic lymph nodes. Reproductive: Uterus and bilateral adnexa are unremarkable. Other: Small amount of free fluid is noted in the pelvis which most likely is physiologic. Moderate size fat containing periumbilical hernia is noted. Musculoskeletal: No acute or significant osseous findings. IMPRESSION: Supraumbilical ventral hernia is noted which contains a portion of transverse colon, but does not result in obstruction. Moderate size fat containing periumbilical hernia is also noted. Nonobstructive left nephrolithiasis. Electronically Signed   By: Marijo Conception, M.D.   On: 12/31/2017 07:19    Procedures Procedures (including critical care time)  Medications Ordered in ED Medications  sodium chloride 0.9 % bolus 1,000 mL (0 mLs Intravenous Stopped 12/31/17 0638)  sodium chloride 0.9 % bolus 500 mL (0 mLs Intravenous Stopped 12/31/17 0649)  ondansetron (ZOFRAN) injection 4 mg (4 mg Intravenous Given 12/31/17 0537)  fentaNYL (SUBLIMAZE) injection 50 mcg (50 mcg Intravenous Given 12/31/17 0537)  iopamidol (ISOVUE-300) 61 % injection 100 mL (100 mLs Intravenous Contrast Given 12/31/17 0556)    ketorolac (TORADOL) 30 MG/ML injection 15  mg (15 mg Intravenous Given 12/31/17 0606)     Initial Impression / Assessment and Plan / ED Course  I have reviewed the triage vital signs and the nursing notes.  Pertinent labs & imaging results that were available during my care of the patient were reviewed by me and considered in my medical decision making (see chart for details).    Patient was given IV fluids, IV pain and nausea medication.  Since she states his pain is different CT the abdomen was done.  Recheck at 8:15 AM she states her pain is better.  We discussed her CT results which was unrevealing.  She did have a lot of free fluid in the pelvis, she is interested in a second GYN opinion.  [facility]   Final Clinical Impressions(s) / ED Diagnoses   Final diagnoses:  Lower abdominal pain    ED Discharge Orders        Ordered    naproxen (NAPROSYN) 500 MG tablet     12/31/17 0832    ondansetron (ZOFRAN) 4 MG tablet  Every 8 hours PRN     12/31/17 0832      Plan discharge  Rolland Porter, MD, Barbette Or, MD 12/31/17 [id number]

## 2017-12-31 NOTE — ED Notes (Signed)
Patient transported to CT 

## 2017-12-31 NOTE — Discharge Instructions (Addendum)
Take the naproxen for pain instead of the ibuprofen and use the zofran for nausea or vomiting.  Call [facility] to get an appointment to evaluate your lower abdominal pain.

## 2018-01-02 ENCOUNTER — Encounter: Payer: Self-pay | Admitting: Obstetrics & Gynecology

## 2018-01-02 ENCOUNTER — Ambulatory Visit: Payer: 59 | Admitting: Obstetrics & Gynecology

## 2018-01-02 VITALS — BP 163/93 | HR 88 | Ht 61.0 in | Wt 235.0 lb

## 2018-01-02 DIAGNOSIS — R102 Pelvic and perineal pain: Secondary | ICD-10-CM

## 2018-01-02 MED ORDER — MEGESTROL ACETATE 40 MG PO TABS: ORAL_TABLET | ORAL | 3 refills | Status: DC

## 2018-01-02 MED ORDER — KETOROLAC TROMETHAMINE 10 MG PO TABS: 10.0000 mg | ORAL_TABLET | Freq: Three times a day (TID) | ORAL | 0 refills | Status: DC | PRN

## 2018-01-02 NOTE — Progress Notes (Signed)
No chief complaint on file.     47 y.o. [id number] No LMP recorded. Patient has had an ablation. The current method of family planning is tubal ligation.  Outpatient Encounter Medications as of 01/02/2018  Medication Sig  . ALPRAZolam (XANAX) 0.5 MG tablet Take 0.25 mg by mouth at bedtime as needed for sleep.  . hydrochlorothiazide (HYDRODIURIL) 25 MG tablet Take 25 mg by mouth daily.  Marland Kitchen levothyroxine (SYNTHROID, LEVOTHROID) 175 MCG tablet Take 288 mcg by mouth daily before breakfast.   . nebivolol (BYSTOLIC) 10 MG tablet Take 10 mg by mouth daily.  . Vitamin D, Ergocalciferol, (DRISDOL) 50000 UNITS CAPS Take 50,000 Units by mouth every 7 (seven) days. Takes on Monday.  Marland Kitchen HYDROcodone-acetaminophen (NORCO/VICODIN) 5-325 MG per tablet Take 1-2 tablets by mouth every 6 (six) hours as needed for pain. (Patient not taking: Reported on 01/02/2018)  . ketorolac (TORADOL) 10 MG tablet Take 1 tablet (10 mg total) by mouth every 8 (eight) hours as needed.  . Levomilnacipran HCl ER (FETZIMA) 20 MG CP24 Take 40 mg by mouth daily.  Marland Kitchen lisdexamfetamine (VYVANSE) 50 MG capsule Take 50 mg by mouth every morning.  . megestrol (MEGACE) 40 MG tablet 1 tablet daily  . naproxen (NAPROSYN) 500 MG tablet Take 1 po BID with food prn pain (Patient not taking: Reported on 01/02/2018)  . ondansetron (ZOFRAN) 4 MG tablet Take 1 tablet (4 mg total) by mouth every 8 (eight) hours as needed for nausea or vomiting. (Patient not taking: Reported on 01/02/2018)  . promethazine (PHENERGAN) 25 MG tablet Take 1 tablet (25 mg total) by mouth every 6 (six) hours as needed for nausea. (Patient not taking: Reported on 01/02/2018)   No facility-administered encounter medications on file as of 01/02/2018.     Subjective Hellen Shanley is seen as a referral from the ED She has not been seen here for this problem  She complains of cyclical lower abdominal/pelvic pain severe monthly She is s/p endometrial ablation 6 years ago,  unknown surgical technique No heavy bleeding associated Past Medical History:  Diagnosis Date  . Anemia   . Depression   . Hypertension   . Thyroid disease     Past Surgical History:  Procedure Laterality Date  . TUBAL LIGATION    . uterine ablasion      OB History    Gravida  4   Para  3   Term  3   Preterm      AB  1   Living  3     SAB      TAB      Ectopic      Multiple      Live Births              No Known Allergies  Social History   Socioeconomic History  . Marital status: Married    Spouse name: Not on file  . Number of children: Not on file  . Years of education: Not on file  . Highest education level: Not on file  Occupational History  . Not on file  Social Needs  . Financial resource strain: Not on file  . Food insecurity:    Worry: Not on file    Inability: Not on file  . Transportation needs:    Medical: Not on file    Non-medical: Not on file  Tobacco Use  . Smoking status: Never Smoker  . Smokeless tobacco: Never Used  Substance and Sexual  Activity  . Alcohol use: Yes    Comment: socially  . Drug use: No  . Sexual activity: Yes    Birth control/protection: None  Lifestyle  . Physical activity:    Days per week: Not on file    Minutes per session: Not on file  . Stress: Not on file  Relationships  . Social connections:    Talks on phone: Not on file    Gets together: Not on file    Attends religious service: Not on file    Active member of club or organization: Not on file    Attends meetings of clubs or organizations: Not on file    Relationship status: Not on file  Other Topics Concern  . Not on file  Social History Narrative  . Not on file    History reviewed. No pertinent family history.  Medications:       Current Outpatient Medications:  .  ALPRAZolam (XANAX) 0.5 MG tablet, Take 0.25 mg by mouth at bedtime as needed for sleep., Disp: , Rfl:  .  hydrochlorothiazide (HYDRODIURIL) 25 MG tablet, Take 25  mg by mouth daily., Disp: , Rfl:  .  levothyroxine (SYNTHROID, LEVOTHROID) 175 MCG tablet, Take 288 mcg by mouth daily before breakfast. , Disp: , Rfl:  .  nebivolol (BYSTOLIC) 10 MG tablet, Take 10 mg by mouth daily., Disp: , Rfl:  .  Vitamin D, Ergocalciferol, (DRISDOL) 50000 UNITS CAPS, Take 50,000 Units by mouth every 7 (seven) days. Takes on Monday., Disp: , Rfl:  .  HYDROcodone-acetaminophen (NORCO/VICODIN) 5-325 MG per tablet, Take 1-2 tablets by mouth every 6 (six) hours as needed for pain. (Patient not taking: Reported on 01/02/2018), Disp: 10 tablet, Rfl: 0 .  ketorolac (TORADOL) 10 MG tablet, Take 1 tablet (10 mg total) by mouth every 8 (eight) hours as needed., Disp: 15 tablet, Rfl: 0 .  Levomilnacipran HCl ER (FETZIMA) 20 MG CP24, Take 40 mg by mouth daily., Disp: , Rfl:  .  lisdexamfetamine (VYVANSE) 50 MG capsule, Take 50 mg by mouth every morning., Disp: , Rfl:  .  megestrol (MEGACE) 40 MG tablet, 1 tablet daily, Disp: 30 tablet, Rfl: 3 .  naproxen (NAPROSYN) 500 MG tablet, Take 1 po BID with food prn pain (Patient not taking: Reported on 01/02/2018), Disp: 30 tablet, Rfl: 0 .  ondansetron (ZOFRAN) 4 MG tablet, Take 1 tablet (4 mg total) by mouth every 8 (eight) hours as needed for nausea or vomiting. (Patient not taking: Reported on 01/02/2018), Disp: 10 tablet, Rfl: 0 .  promethazine (PHENERGAN) 25 MG tablet, Take 1 tablet (25 mg total) by mouth every 6 (six) hours as needed for nausea. (Patient not taking: Reported on 01/02/2018), Disp: 12 tablet, Rfl: 0  Objective Blood pressure (!) 163/93, pulse 88, height 5\' 1"  (1.549 m), weight 235 lb (106.6 kg).  General WDWN female NAD Vulva:  normal appearing vulva with no masses, tenderness or lesions Vagina:  normal mucosa, no discharge Cervix:  no cervical motion tenderness and no lesions Uterus:  normal size, contour, position, consistency, mobility, non-tender Adnexa: ovaries:present,  normal adnexa in size, nontender and no  masses   Pertinent ROS No burning with urination, frequency or urgency No nausea, vomiting or diarrhea Nor fever chills or other constitutional symptoms   Labs or studies Labs and CT scan from ED is reviewed    Impression Diagnoses this Encounter::   ICD-10-CM   1. Pelvic pain in female Z56.3    cyclical  Established relevant diagnosis(es): S/p ablation  Plan/Recommendations: Meds ordered this encounter  Medications  . megestrol (MEGACE) 40 MG tablet    Sig: 1 tablet daily    Dispense:  30 tablet    Refill:  3  . ketorolac (TORADOL) 10 MG tablet    Sig: Take 1 tablet (10 mg total) by mouth every 8 (eight) hours as needed.    Dispense:  15 tablet    Refill:  0    Labs or Scans Ordered: No orders of the defined types were placed in this encounter.   Management:: >Suppress endometrial development with megestrol and use  toradol for pain  >Symptoms consistent with hematometra but not seen on CT scan   Follow up Return in about 2 months (around 03/05/2018) for Follow up, with Dr Elonda Husky. And see clinical respons to the managemtn      All questions were answered.

## 2018-03-07 ENCOUNTER — Ambulatory Visit: Payer: 59 | Admitting: Obstetrics & Gynecology

## 2018-03-20 ENCOUNTER — Ambulatory Visit: Payer: 59 | Admitting: Obstetrics & Gynecology

## 2018-03-20 ENCOUNTER — Encounter: Payer: Self-pay | Admitting: Obstetrics & Gynecology

## 2018-04-10 ENCOUNTER — Ambulatory Visit: Payer: 59 | Admitting: Obstetrics & Gynecology

## 2018-04-17 ENCOUNTER — Other Ambulatory Visit: Payer: Self-pay

## 2018-04-17 ENCOUNTER — Ambulatory Visit: Payer: 59 | Admitting: Obstetrics & Gynecology

## 2018-04-17 ENCOUNTER — Encounter: Payer: Self-pay | Admitting: Obstetrics & Gynecology

## 2018-04-17 VITALS — BP 150/96 | HR 103 | Ht 61.0 in | Wt 238.0 lb

## 2018-04-17 DIAGNOSIS — N814 Uterovaginal prolapse, unspecified: Secondary | ICD-10-CM

## 2018-04-17 DIAGNOSIS — B9689 Other specified bacterial agents as the cause of diseases classified elsewhere: Secondary | ICD-10-CM | POA: Diagnosis not present

## 2018-04-17 DIAGNOSIS — R102 Pelvic and perineal pain: Secondary | ICD-10-CM

## 2018-04-17 DIAGNOSIS — N760 Acute vaginitis: Secondary | ICD-10-CM

## 2018-04-17 MED ORDER — METRONIDAZOLE 0.75 % VA GEL: VAGINAL | 0 refills | Status: DC

## 2018-04-17 NOTE — Progress Notes (Signed)
Chief Complaint  Patient presents with  . Follow-up      47 y.o. [id number] No LMP recorded. Patient has had an ablation. The current method of family planning is tubal ligation.  Outpatient Encounter Medications as of 04/17/2018  Medication Sig  . ALPRAZolam (XANAX) 0.5 MG tablet Take 0.25 mg by mouth at bedtime as needed for sleep.  Marland Kitchen amLODipine (NORVASC) 10 MG tablet   . dicyclomine (BENTYL) 20 MG tablet   . hydrochlorothiazide (HYDRODIURIL) 25 MG tablet Take 25 mg by mouth daily.  Marland Kitchen HYDROcodone-acetaminophen (NORCO/VICODIN) 5-325 MG per tablet Take 1-2 tablets by mouth every 6 (six) hours as needed for pain.  Marland Kitchen levothyroxine (SYNTHROID, LEVOTHROID) 175 MCG tablet Take 288 mcg by mouth daily before breakfast.   . megestrol (MEGACE) 40 MG tablet 1 tablet daily  . naproxen (NAPROSYN) 500 MG tablet Take 1 po BID with food prn pain  . nebivolol (BYSTOLIC) 10 MG tablet Take 10 mg by mouth daily.  . Vitamin D, Ergocalciferol, (DRISDOL) 50000 UNITS CAPS Take 50,000 Units by mouth every 7 (seven) days. Takes on Monday.  Marland Kitchen ketorolac (TORADOL) 10 MG tablet Take 1 tablet (10 mg total) by mouth every 8 (eight) hours as needed. (Patient not taking: Reported on 04/17/2018)  . lisdexamfetamine (VYVANSE) 50 MG capsule Take 50 mg by mouth every morning.  . metroNIDAZOLE (METROGEL VAGINAL) 0.75 % vaginal gel Nightly x 5 nights  . ondansetron (ZOFRAN) 4 MG tablet Take 1 tablet (4 mg total) by mouth every 8 (eight) hours as needed for nausea or vomiting. (Patient not taking: Reported on 01/02/2018)  . promethazine (PHENERGAN) 25 MG tablet Take 1 tablet (25 mg total) by mouth every 6 (six) hours as needed for nausea. (Patient not taking: Reported on 01/02/2018)  . [DISCONTINUED] Levomilnacipran HCl ER (FETZIMA) 20 MG CP24 Take 40 mg by mouth daily.  . [DISCONTINUED] levothyroxine (SYNTHROID, LEVOTHROID) 200 MCG tablet    No facility-administered encounter medications on file as of 04/17/2018.      Subjective Pt is 6-7 years post ablation Having increased problems with midline and lateral pelvic pain No bleeding CT did not reveal a hematometra and her symptoms are lateral as well Placed on megestrol and toradol for 3 months with minimal improvement  Here for re evaluation Past Medical History:  Diagnosis Date  . Anemia   . Depression   . Hypertension   . Thyroid disease     Past Surgical History:  Procedure Laterality Date  . TUBAL LIGATION    . uterine ablasion      OB History    Gravida  4   Para  3   Term  3   Preterm      AB  1   Living  3     SAB      TAB      Ectopic      Multiple      Live Births              No Known Allergies  Social History   Socioeconomic History  . Marital status: Married    Spouse name: Not on file  . Number of children: Not on file  . Years of education: Not on file  . Highest education level: Not on file  Occupational History  . Not on file  Social Needs  . Financial resource strain: Not on file  . Food insecurity:    Worry: Not on file  Inability: Not on file  . Transportation needs:    Medical: Not on file    Non-medical: Not on file  Tobacco Use  . Smoking status: Never Smoker  . Smokeless tobacco: Never Used  Substance and Sexual Activity  . Alcohol use: Yes    Comment: socially  . Drug use: No  . Sexual activity: Yes    Birth control/protection: None  Lifestyle  . Physical activity:    Days per week: Not on file    Minutes per session: Not on file  . Stress: Not on file  Relationships  . Social connections:    Talks on phone: Not on file    Gets together: Not on file    Attends religious service: Not on file    Active member of club or organization: Not on file    Attends meetings of clubs or organizations: Not on file    Relationship status: Not on file  Other Topics Concern  . Not on file  Social History Narrative  . Not on file    History reviewed. No pertinent  family history.  Medications:       Current Outpatient Medications:  .  ALPRAZolam (XANAX) 0.5 MG tablet, Take 0.25 mg by mouth at bedtime as needed for sleep., Disp: , Rfl:  .  amLODipine (NORVASC) 10 MG tablet, , Disp: , Rfl: 3 .  dicyclomine (BENTYL) 20 MG tablet, , Disp: , Rfl: 2 .  hydrochlorothiazide (HYDRODIURIL) 25 MG tablet, Take 25 mg by mouth daily., Disp: , Rfl:  .  HYDROcodone-acetaminophen (NORCO/VICODIN) 5-325 MG per tablet, Take 1-2 tablets by mouth every 6 (six) hours as needed for pain., Disp: 10 tablet, Rfl: 0 .  levothyroxine (SYNTHROID, LEVOTHROID) 175 MCG tablet, Take 288 mcg by mouth daily before breakfast. , Disp: , Rfl:  .  megestrol (MEGACE) 40 MG tablet, 1 tablet daily, Disp: 30 tablet, Rfl: 3 .  naproxen (NAPROSYN) 500 MG tablet, Take 1 po BID with food prn pain, Disp: 30 tablet, Rfl: 0 .  nebivolol (BYSTOLIC) 10 MG tablet, Take 10 mg by mouth daily., Disp: , Rfl:  .  Vitamin D, Ergocalciferol, (DRISDOL) 50000 UNITS CAPS, Take 50,000 Units by mouth every 7 (seven) days. Takes on Monday., Disp: , Rfl:  .  ketorolac (TORADOL) 10 MG tablet, Take 1 tablet (10 mg total) by mouth every 8 (eight) hours as needed. (Patient not taking: Reported on 04/17/2018), Disp: 15 tablet, Rfl: 0 .  lisdexamfetamine (VYVANSE) 50 MG capsule, Take 50 mg by mouth every morning., Disp: , Rfl:  .  metroNIDAZOLE (METROGEL VAGINAL) 0.75 % vaginal gel, Nightly x 5 nights, Disp: 70 g, Rfl: 0 .  ondansetron (ZOFRAN) 4 MG tablet, Take 1 tablet (4 mg total) by mouth every 8 (eight) hours as needed for nausea or vomiting. (Patient not taking: Reported on 01/02/2018), Disp: 10 tablet, Rfl: 0 .  promethazine (PHENERGAN) 25 MG tablet, Take 1 tablet (25 mg total) by mouth every 6 (six) hours as needed for nausea. (Patient not taking: Reported on 01/02/2018), Disp: 12 tablet, Rfl: 0  Objective Blood pressure (!) 150/96, pulse (!) 103, height 5\' 1"  (1.549 m), weight 238 lb (108 kg).  General WDWN female  NAD Vulva:  normal appearing vulva with no masses, tenderness or lesions Vagina:  normal mucosa, thin grey discharge +BV Cervix:  no cervical motion tenderness and no lesions Uterus:  Tender to exam grade 2 relaxation with bilateral pain on adnexal exam Adnexa: ovaries:present,  Bilateral tenderness but  normal size   Pertinent ROS No burning with urination, frequency or urgency No nausea, vomiting or diarrhea Nor fever chills or other constitutional symptoms   Labs or studies Wet prep +BV    Impression Diagnoses this Encounter::   ICD-10-CM   1. Uterine prolapse N81.4   2. BV (bacterial vaginosis) N76.0    B96.89   3. Pelvic pain in female R10.2     Established relevant diagnosis(es):   Plan/Recommendations: Meds ordered this encounter  Medications  . metroNIDAZOLE (METROGEL VAGINAL) 0.75 % vaginal gel    Sig: Nightly x 5 nights    Dispense:  70 g    Refill:  0    Labs or Scans Ordered: No orders of the defined types were placed in this encounter.   Management:: Recommend [facility] BSO for pelvic relaxation and on going pelvic pain unresponsive to aggressive progesterone therapy and NSAIDs Metro gel for BV  Follow up Return if symptoms worsen or fail to improve.       All questions were answered.

## 2019-05-11 ENCOUNTER — Other Ambulatory Visit: Payer: Self-pay

## 2019-05-11 ENCOUNTER — Encounter (HOSPITAL_COMMUNITY): Payer: Self-pay | Admitting: *Deleted

## 2019-05-11 ENCOUNTER — Inpatient Hospital Stay (HOSPITAL_COMMUNITY)
Admission: EM | Admit: 2019-05-11 | Discharge: 2019-05-16 | DRG: 178 | Disposition: A | Discharge status: Home / Self Care | Payer: 59 | Attending: Internal Medicine | Admitting: Internal Medicine

## 2019-05-11 DIAGNOSIS — F329 Major depressive disorder, single episode, unspecified: Secondary | ICD-10-CM | POA: Diagnosis present

## 2019-05-11 DIAGNOSIS — D649 Anemia, unspecified: Secondary | ICD-10-CM | POA: Diagnosis present

## 2019-05-11 DIAGNOSIS — N852 Hypertrophy of uterus: Secondary | ICD-10-CM | POA: Diagnosis present

## 2019-05-11 DIAGNOSIS — R1032 Left lower quadrant pain: Secondary | ICD-10-CM | POA: Diagnosis not present

## 2019-05-11 DIAGNOSIS — F32A Depression, unspecified: Secondary | ICD-10-CM | POA: Diagnosis present

## 2019-05-11 DIAGNOSIS — Z6841 Body Mass Index (BMI) 40.0 and over, adult: Secondary | ICD-10-CM

## 2019-05-11 DIAGNOSIS — A0839 Other viral enteritis: Secondary | ICD-10-CM | POA: Diagnosis present

## 2019-05-11 DIAGNOSIS — E876 Hypokalemia: Secondary | ICD-10-CM | POA: Diagnosis present

## 2019-05-11 DIAGNOSIS — Z79890 Hormone replacement therapy (postmenopausal): Secondary | ICD-10-CM

## 2019-05-11 DIAGNOSIS — U071 COVID-19: Secondary | ICD-10-CM | POA: Diagnosis not present

## 2019-05-11 DIAGNOSIS — Z791 Long term (current) use of non-steroidal anti-inflammatories (NSAID): Secondary | ICD-10-CM

## 2019-05-11 DIAGNOSIS — I10 Essential (primary) hypertension: Secondary | ICD-10-CM | POA: Diagnosis present

## 2019-05-11 DIAGNOSIS — Z79891 Long term (current) use of opiate analgesic: Secondary | ICD-10-CM

## 2019-05-11 DIAGNOSIS — E039 Hypothyroidism, unspecified: Secondary | ICD-10-CM | POA: Diagnosis present

## 2019-05-11 DIAGNOSIS — E669 Obesity, unspecified: Secondary | ICD-10-CM | POA: Diagnosis present

## 2019-05-11 DIAGNOSIS — Z79899 Other long term (current) drug therapy: Secondary | ICD-10-CM

## 2019-05-11 DIAGNOSIS — R197 Diarrhea, unspecified: Secondary | ICD-10-CM

## 2019-05-11 DIAGNOSIS — K529 Noninfective gastroenteritis and colitis, unspecified: Secondary | ICD-10-CM | POA: Diagnosis present

## 2019-05-11 NOTE — ED Triage Notes (Signed)
Pt c/o generalized abdominal pain that radiates around to her back; pt states she has been having the pain for the last few days; pt c/o nausea and some loose stools; pt denies any urinary sx

## 2019-05-12 ENCOUNTER — Emergency Department (HOSPITAL_COMMUNITY): Payer: 59

## 2019-05-12 ENCOUNTER — Inpatient Hospital Stay (HOSPITAL_COMMUNITY): Payer: 59

## 2019-05-12 ENCOUNTER — Other Ambulatory Visit: Payer: Self-pay

## 2019-05-12 DIAGNOSIS — E876 Hypokalemia: Secondary | ICD-10-CM | POA: Diagnosis present

## 2019-05-12 DIAGNOSIS — E039 Hypothyroidism, unspecified: Secondary | ICD-10-CM | POA: Diagnosis present

## 2019-05-12 DIAGNOSIS — Z79891 Long term (current) use of opiate analgesic: Secondary | ICD-10-CM | POA: Diagnosis not present

## 2019-05-12 DIAGNOSIS — R1032 Left lower quadrant pain: Secondary | ICD-10-CM | POA: Diagnosis present

## 2019-05-12 DIAGNOSIS — K529 Noninfective gastroenteritis and colitis, unspecified: Secondary | ICD-10-CM | POA: Diagnosis not present

## 2019-05-12 DIAGNOSIS — F32A Depression, unspecified: Secondary | ICD-10-CM | POA: Diagnosis present

## 2019-05-12 DIAGNOSIS — E669 Obesity, unspecified: Secondary | ICD-10-CM | POA: Diagnosis present

## 2019-05-12 DIAGNOSIS — R197 Diarrhea, unspecified: Secondary | ICD-10-CM

## 2019-05-12 DIAGNOSIS — A0839 Other viral enteritis: Secondary | ICD-10-CM | POA: Diagnosis present

## 2019-05-12 DIAGNOSIS — I10 Essential (primary) hypertension: Secondary | ICD-10-CM | POA: Diagnosis present

## 2019-05-12 DIAGNOSIS — Z6841 Body Mass Index (BMI) 40.0 and over, adult: Secondary | ICD-10-CM | POA: Diagnosis not present

## 2019-05-12 DIAGNOSIS — Z79890 Hormone replacement therapy (postmenopausal): Secondary | ICD-10-CM | POA: Diagnosis not present

## 2019-05-12 DIAGNOSIS — Z791 Long term (current) use of non-steroidal anti-inflammatories (NSAID): Secondary | ICD-10-CM | POA: Diagnosis not present

## 2019-05-12 DIAGNOSIS — D649 Anemia, unspecified: Secondary | ICD-10-CM | POA: Diagnosis present

## 2019-05-12 DIAGNOSIS — N852 Hypertrophy of uterus: Secondary | ICD-10-CM | POA: Diagnosis present

## 2019-05-12 DIAGNOSIS — F329 Major depressive disorder, single episode, unspecified: Secondary | ICD-10-CM | POA: Diagnosis present

## 2019-05-12 DIAGNOSIS — U071 COVID-19: Secondary | ICD-10-CM | POA: Diagnosis present

## 2019-05-12 DIAGNOSIS — Z79899 Other long term (current) drug therapy: Secondary | ICD-10-CM | POA: Diagnosis not present

## 2019-05-12 LAB — COMPREHENSIVE METABOLIC PANEL
ALT: 13 U/L (ref 0–44)
ALT: 12 U/L (ref 0–44)
AST: 16 U/L (ref 15–41)
AST: 14 U/L — ABNORMAL LOW (ref 15–41)
Albumin: 4 g/dL (ref 3.5–5.0)
Albumin: 3.6 g/dL (ref 3.5–5.0)
Alkaline Phosphatase: 55 U/L (ref 38–126)
Alkaline Phosphatase: 48 U/L (ref 38–126)
Anion gap: 10 (ref 5–15)
Anion gap: 9 (ref 5–15)
BUN: 10 mg/dL (ref 6–20)
BUN: 8 mg/dL (ref 6–20)
CO2: 23 mmol/L (ref 22–32)
CO2: 24 mmol/L (ref 22–32)
Calcium: 8.2 mg/dL — ABNORMAL LOW (ref 8.9–10.3)
Calcium: 7.6 mg/dL — ABNORMAL LOW (ref 8.9–10.3)
Chloride: 104 mmol/L (ref 98–111)
Chloride: 105 mmol/L (ref 98–111)
Creatinine, Ser: 0.84 mg/dL (ref 0.44–1.00)
Creatinine, Ser: 0.85 mg/dL (ref 0.44–1.00)
GFR calc Af Amer: >60 mL/min (ref >60)
GFR calc Af Amer: >60 mL/min (ref >60)
GFR calc non Af Amer: >60 mL/min (ref >60)
GFR calc non Af Amer: >60 mL/min (ref >60)
Glucose, Bld: 131 mg/dL — ABNORMAL HIGH (ref 70–99)
Glucose, Bld: 112 mg/dL — ABNORMAL HIGH (ref 70–99)
Potassium: 3.4 mmol/L — ABNORMAL LOW (ref 3.5–5.1)
Potassium: 3.3 mmol/L — ABNORMAL LOW (ref 3.5–5.1)
Sodium: 137 mmol/L (ref 135–145)
Sodium: 138 mmol/L (ref 135–145)
Total Bilirubin: 0.8 mg/dL (ref 0.3–1.2)
Total Bilirubin: 0.8 mg/dL (ref 0.3–1.2)
Total Protein: 8.2 g/dL — ABNORMAL HIGH (ref 6.5–8.1)
Total Protein: 7.5 g/dL (ref 6.5–8.1)

## 2019-05-12 LAB — CBC WITH DIFFERENTIAL/PLATELET
Abs Immature Granulocytes: 0.04 10*3/uL (ref 0.00–0.07)
Basophils Absolute: 0 10*3/uL (ref 0.0–0.1)
Basophils Relative: 0 %
Eosinophils Absolute: 0 10*3/uL (ref 0.0–0.5)
Eosinophils Relative: 0 %
HCT: 38.3 % (ref 36.0–46.0)
Hemoglobin: 12 g/dL (ref 12.0–15.0)
Immature Granulocytes: 1 %
Lymphocytes Relative: 6 %
Lymphs Abs: 0.5 10*3/uL — ABNORMAL LOW (ref 0.7–4.0)
MCH: 27.5 pg (ref 26.0–34.0)
MCHC: 31.3 g/dL (ref 30.0–36.0)
MCV: 87.6 fL (ref 80.0–100.0)
Monocytes Absolute: 0.5 10*3/uL (ref 0.1–1.0)
Monocytes Relative: 6 %
Neutro Abs: 7.2 10*3/uL (ref 1.7–7.7)
Neutrophils Relative %: 87 %
Platelets: 205 10*3/uL (ref 150–400)
RBC: 4.37 MIL/uL (ref 3.87–5.11)
RDW: 14.2 % (ref 11.5–15.5)
WBC: 8.2 10*3/uL (ref 4.0–10.5)
nRBC: 0 % (ref 0.0–0.2)

## 2019-05-12 LAB — SARS CORONAVIRUS 2 (TAT 6-24 HRS): SARS Coronavirus 2: POSITIVE — AB

## 2019-05-12 LAB — URINALYSIS, ROUTINE W REFLEX MICROSCOPIC
Bacteria, UA: NONE SEEN
Bilirubin Urine: NEGATIVE
Glucose, UA: NEGATIVE mg/dL
Hgb urine dipstick: NEGATIVE
Ketones, ur: 20 mg/dL — AB
Leukocytes,Ua: NEGATIVE
Nitrite: NEGATIVE
Protein, ur: 30 mg/dL — AB
Specific Gravity, Urine: >1.046 — ABNORMAL HIGH (ref 1.005–1.030)
pH: 5 (ref 5.0–8.0)

## 2019-05-12 LAB — CBC
HCT: 36.5 % (ref 36.0–46.0)
Hemoglobin: 11.4 g/dL — ABNORMAL LOW (ref 12.0–15.0)
MCH: 27.7 pg (ref 26.0–34.0)
MCHC: 31.2 g/dL (ref 30.0–36.0)
MCV: 88.6 fL (ref 80.0–100.0)
Platelets: 194 10*3/uL (ref 150–400)
RBC: 4.12 MIL/uL (ref 3.87–5.11)
RDW: 14.5 % (ref 11.5–15.5)
WBC: 7.9 10*3/uL (ref 4.0–10.5)
nRBC: 0 % (ref 0.0–0.2)

## 2019-05-12 LAB — PREGNANCY, URINE: Preg Test, Ur: NEGATIVE

## 2019-05-12 LAB — LIPASE, BLOOD: Lipase: 17 U/L (ref 11–51)

## 2019-05-12 MED ORDER — IOHEXOL 300 MG/ML  SOLN
100.0000 mL | Freq: Once | INTRAMUSCULAR | Status: AC | PRN
  Administered 2019-05-12: 100 mL via INTRAVENOUS

## 2019-05-12 MED ORDER — LABETALOL HCL 5 MG/ML IV SOLN: 5.0000 mg | INTRAVENOUS | Status: DC | PRN

## 2019-05-12 MED ORDER — FERROUS SULFATE 325 (65 FE) MG PO TABS
325.0000 mg | ORAL_TABLET | Freq: Every day | ORAL | Status: DC
  Administered 2019-05-13 – 2019-05-16 (×4): 325 mg via ORAL
  Filled 2019-05-12 (×4): qty 1

## 2019-05-12 MED ORDER — FENTANYL CITRATE (PF) 100 MCG/2ML IJ SOLN
50.0000 ug | INTRAMUSCULAR | Status: DC | PRN
  Administered 2019-05-12 – 2019-05-16 (×10): 50 ug via INTRAVENOUS
  Filled 2019-05-12 (×10): qty 2

## 2019-05-12 MED ORDER — ACETAMINOPHEN 650 MG RE SUPP: 650.0000 mg | Freq: Four times a day (QID) | RECTAL | Status: DC | PRN

## 2019-05-12 MED ORDER — ONDANSETRON HCL 4 MG PO TABS: 4.0000 mg | ORAL_TABLET | Freq: Four times a day (QID) | ORAL | Status: DC | PRN

## 2019-05-12 MED ORDER — SODIUM CHLORIDE 0.9 % IV BOLUS
500.0000 mL | Freq: Once | INTRAVENOUS | Status: AC
  Administered 2019-05-12: 500 mL via INTRAVENOUS

## 2019-05-12 MED ORDER — PROMETHAZINE HCL 25 MG/ML IJ SOLN
12.5000 mg | Freq: Once | INTRAMUSCULAR | Status: AC
  Administered 2019-05-12: 12.5 mg via INTRAVENOUS
  Filled 2019-05-12: qty 1

## 2019-05-12 MED ORDER — METRONIDAZOLE IN NACL 5-0.79 MG/ML-% IV SOLN
500.0000 mg | Freq: Three times a day (TID) | INTRAVENOUS | Status: DC
  Administered 2019-05-12 – 2019-05-13 (×3): 500 mg via INTRAVENOUS
  Filled 2019-05-12 (×4): qty 100

## 2019-05-12 MED ORDER — AMLODIPINE BESYLATE 10 MG PO TABS
10.0000 mg | ORAL_TABLET | Freq: Every day | ORAL | Status: DC
  Administered 2019-05-12 – 2019-05-16 (×5): 10 mg via ORAL
  Filled 2019-05-12 (×2): qty 1
  Filled 2019-05-12: qty 2
  Filled 2019-05-12 (×2): qty 1

## 2019-05-12 MED ORDER — ACETAMINOPHEN 325 MG PO TABS
650.0000 mg | ORAL_TABLET | Freq: Four times a day (QID) | ORAL | Status: DC | PRN
  Administered 2019-05-13 – 2019-05-15 (×5): 650 mg via ORAL
  Filled 2019-05-12 (×5): qty 2

## 2019-05-12 MED ORDER — NEBIVOLOL HCL 10 MG PO TABS
10.0000 mg | ORAL_TABLET | Freq: Every day | ORAL | Status: DC
  Administered 2019-05-13 – 2019-05-15 (×3): 10 mg via ORAL
  Filled 2019-05-12 (×5): qty 1

## 2019-05-12 MED ORDER — METRONIDAZOLE IN NACL 5-0.79 MG/ML-% IV SOLN
500.0000 mg | Freq: Once | INTRAVENOUS | Status: AC
  Administered 2019-05-12: 500 mg via INTRAVENOUS
  Filled 2019-05-12: qty 100

## 2019-05-12 MED ORDER — FENTANYL CITRATE (PF) 100 MCG/2ML IJ SOLN
50.0000 ug | Freq: Once | INTRAMUSCULAR | Status: AC
  Administered 2019-05-12: 50 ug via INTRAVENOUS
  Filled 2019-05-12: qty 2

## 2019-05-12 MED ORDER — POTASSIUM CHLORIDE IN NACL 20-0.9 MEQ/L-% IV SOLN
INTRAVENOUS | Status: DC
  Administered 2019-05-12 (×3): via INTRAVENOUS
  Filled 2019-05-12 (×3): qty 1000

## 2019-05-12 MED ORDER — ONDANSETRON HCL 4 MG/2ML IJ SOLN
4.0000 mg | Freq: Four times a day (QID) | INTRAMUSCULAR | Status: DC | PRN
  Administered 2019-05-12 – 2019-05-15 (×4): 4 mg via INTRAVENOUS
  Filled 2019-05-12 (×4): qty 2

## 2019-05-12 MED ORDER — CIPROFLOXACIN IN D5W 400 MG/200ML IV SOLN
400.0000 mg | Freq: Once | INTRAVENOUS | Status: AC
  Administered 2019-05-12: 400 mg via INTRAVENOUS
  Filled 2019-05-12: qty 200

## 2019-05-12 MED ORDER — BOOST / RESOURCE BREEZE PO LIQD CUSTOM
1.0000 | Freq: Three times a day (TID) | ORAL | Status: DC
  Administered 2019-05-12: 1 via ORAL
  Filled 2019-05-12 (×6): qty 1

## 2019-05-12 MED ORDER — LISDEXAMFETAMINE DIMESYLATE 50 MG PO CAPS
50.0000 mg | ORAL_CAPSULE | ORAL | Status: DC
  Administered 2019-05-12: 50 mg via ORAL
  Filled 2019-05-12: qty 1

## 2019-05-12 MED ORDER — ONDANSETRON HCL 4 MG/2ML IJ SOLN
4.0000 mg | Freq: Once | INTRAMUSCULAR | Status: AC
  Administered 2019-05-12: 4 mg via INTRAVENOUS
  Filled 2019-05-12: qty 2

## 2019-05-12 MED ORDER — ALPRAZOLAM 0.5 MG PO TABS: 0.2500 mg | ORAL_TABLET | Freq: Every evening | ORAL | Status: DC | PRN

## 2019-05-12 MED ORDER — HYDROMORPHONE HCL 1 MG/ML IJ SOLN
0.5000 mg | Freq: Once | INTRAMUSCULAR | Status: AC
  Administered 2019-05-12: 0.5 mg via INTRAVENOUS
  Filled 2019-05-12: qty 0.5

## 2019-05-12 MED ORDER — ENOXAPARIN SODIUM 40 MG/0.4ML ~~LOC~~ SOLN
40.0000 mg | SUBCUTANEOUS | Status: DC
  Administered 2019-05-12 – 2019-05-16 (×5): 40 mg via SUBCUTANEOUS
  Filled 2019-05-12 (×5): qty 0.4

## 2019-05-12 MED ORDER — LEVOTHYROXINE SODIUM 88 MCG PO TABS
288.0000 ug | ORAL_TABLET | Freq: Every day | ORAL | Status: DC
  Administered 2019-05-13 – 2019-05-16 (×4): 288 ug via ORAL
  Filled 2019-05-12 (×6): qty 1

## 2019-05-12 MED ORDER — CIPROFLOXACIN IN D5W 400 MG/200ML IV SOLN
400.0000 mg | Freq: Two times a day (BID) | INTRAVENOUS | Status: DC
  Administered 2019-05-12 – 2019-05-13 (×2): 400 mg via INTRAVENOUS
  Filled 2019-05-12 (×2): qty 200

## 2019-05-12 MED ORDER — HYDROCHLOROTHIAZIDE 25 MG PO TABS
25.0000 mg | ORAL_TABLET | Freq: Every day | ORAL | Status: DC
  Administered 2019-05-12: 25 mg via ORAL
  Filled 2019-05-12 (×4): qty 1

## 2019-05-12 MED ORDER — SODIUM CHLORIDE 0.9 % IV BOLUS
1000.0000 mL | Freq: Once | INTRAVENOUS | Status: AC
  Administered 2019-05-12: 1000 mL via INTRAVENOUS

## 2019-05-12 NOTE — Progress Notes (Addendum)
ASSUMPTION OF CARE NOTE   05/12/2019 5:03 PM  Karen Cruz was seen and examined.  The H&P by the admitting provider, orders, imaging was reviewed.  Please see new orders.  The patient is still having significant abdominal pain.  Her IV antibiotics and other treatments have just been started.  IV pain medication is ordered.  Reconciled home medications.  Will continue to follow.   Vitals:   05/12/19 1030 05/12/19 1633  BP: 136/87 (!) 144/80  Pulse: (!) 119 (!) 106  Resp: 18 20  Temp: 99.3 F (37.4 C)   SpO2: 95% 95%    Results for orders placed or performed during the hospital encounter of 05/11/19  Comprehensive metabolic panel  Result Value Ref Range   Sodium 137 135 - 145 mmol/L   Potassium 3.4 (L) 3.5 - 5.1 mmol/L   Chloride 104 98 - 111 mmol/L   CO2 23 22 - 32 mmol/L   Glucose, Bld 131 (H) 70 - 99 mg/dL   BUN 10 6 - 20 mg/dL   Creatinine, Ser 0.84 0.44 - 1.00 mg/dL   Calcium 8.2 (L) 8.9 - 10.3 mg/dL   Total Protein 8.2 (H) 6.5 - 8.1 g/dL   Albumin 4.0 3.5 - 5.0 g/dL   AST 16 15 - 41 U/L   ALT 13 0 - 44 U/L   Alkaline Phosphatase 55 38 - 126 U/L   Total Bilirubin 0.8 0.3 - 1.2 mg/dL   GFR calc non Af Amer >60 >60 mL/min   GFR calc Af Amer >60 >60 mL/min   Anion gap 10 5 - 15  Lipase, blood  Result Value Ref Range   Lipase 17 11 - 51 U/L  CBC with Differential  Result Value Ref Range   WBC 8.2 4.0 - 10.5 K/uL   RBC 4.37 3.87 - 5.11 MIL/uL   Hemoglobin 12.0 12.0 - 15.0 g/dL   HCT 38.3 36.0 - 46.0 %   MCV 87.6 80.0 - 100.0 fL   MCH 27.5 26.0 - 34.0 pg   MCHC 31.3 30.0 - 36.0 g/dL   RDW 14.2 11.5 - 15.5 %   Platelets 205 150 - 400 K/uL   nRBC 0.0 0.0 - 0.2 %   Neutrophils Relative % 87 %   Neutro Abs 7.2 1.7 - 7.7 K/uL   Lymphocytes Relative 6 %   Lymphs Abs 0.5 (L) 0.7 - 4.0 K/uL   Monocytes Relative 6 %   Monocytes Absolute 0.5 0.1 - 1.0 K/uL   Eosinophils Relative 0 %   Eosinophils Absolute 0.0 0.0 - 0.5 K/uL   Basophils Relative 0 %   Basophils  Absolute 0.0 0.0 - 0.1 K/uL   Immature Granulocytes 1 %   Abs Immature Granulocytes 0.04 0.00 - 0.07 K/uL  Urinalysis, Routine w reflex microscopic  Result Value Ref Range   Color, Urine YELLOW YELLOW   APPearance CLEAR CLEAR   Specific Gravity, Urine >1.046 (H) 1.005 - 1.030   pH 5.0 5.0 - 8.0   Glucose, UA NEGATIVE NEGATIVE mg/dL   Hgb urine dipstick NEGATIVE NEGATIVE   Bilirubin Urine NEGATIVE NEGATIVE   Ketones, ur 20 (A) NEGATIVE mg/dL   Protein, ur 30 (A) NEGATIVE mg/dL   Nitrite NEGATIVE NEGATIVE   Leukocytes,Ua NEGATIVE NEGATIVE   RBC / HPF 0-5 0 - 5 RBC/hpf   WBC, UA 0-5 0 - 5 WBC/hpf   Bacteria, UA NONE SEEN NONE SEEN   Mucus PRESENT   Pregnancy, urine  Result Value Ref Range  Preg Test, Ur NEGATIVE NEGATIVE  CBC  Result Value Ref Range   WBC 7.9 4.0 - 10.5 K/uL   RBC 4.12 3.87 - 5.11 MIL/uL   Hemoglobin 11.4 (L) 12.0 - 15.0 g/dL   HCT 36.5 36.0 - 46.0 %   MCV 88.6 80.0 - 100.0 fL   MCH 27.7 26.0 - 34.0 pg   MCHC 31.2 30.0 - 36.0 g/dL   RDW 14.5 11.5 - 15.5 %   Platelets 194 150 - 400 K/uL   nRBC 0.0 0.0 - 0.2 %  Comprehensive metabolic panel  Result Value Ref Range   Sodium 138 135 - 145 mmol/L   Potassium 3.3 (L) 3.5 - 5.1 mmol/L   Chloride 105 98 - 111 mmol/L   CO2 24 22 - 32 mmol/L   Glucose, Bld 112 (H) 70 - 99 mg/dL   BUN 8 6 - 20 mg/dL   Creatinine, Ser 0.85 0.44 - 1.00 mg/dL   Calcium 7.6 (L) 8.9 - 10.3 mg/dL   Total Protein 7.5 6.5 - 8.1 g/dL   Albumin 3.6 3.5 - 5.0 g/dL   AST 14 (L) 15 - 41 U/L   ALT 12 0 - 44 U/L   Alkaline Phosphatase 48 38 - 126 U/L   Total Bilirubin 0.8 0.3 - 1.2 mg/dL   GFR calc non Af Amer >60 >60 mL/min   GFR calc Af Amer >60 >60 mL/min   Anion gap 9 5 - 15   Time spent 33 minutes  Murvin Natal, MD [facility]   05/11/2019 11:44 PM How to contact the [facility] Attending or Consulting provider [place] or covering provider during after hours 7P -7A, for this patient?  1. Check the care team in [facility] and look for a)  attending/consulting [facility] provider listed and b) the [facility] team listed 2. Log into [email or web address] and use [place]'s universal password to access. If you do not have the password, please contact the hospital operator. 3. Locate the [facility] provider you are looking for under [facility] and page to a number that you can be directly reached. 4. If you still have difficulty reaching the provider, please page the [facility] (Director on Call) for the Hospitalists listed on amion for assistance.

## 2019-05-12 NOTE — H&P (Signed)
TRH H&P    Patient Demographics:    Karen Cruz, is a 48 y.o. female  MRN: [id number]  DOB - 01/31/1971  Admit Date - 05/11/2019  Referring MD/NP/PA: Dr. Tomi Bamberger  Outpatient Primary MD for the patient is Guy Begin, [place]  Patient coming from: Home  Chief complaint-abdominal pain   HPI:    Karen Cruz  is a 48 y.o. female, with history of hypertension, chronic anemia, depression, hypothyroidism came to ED with complaints of headache, chills, sore throat fever on and off rhinorrhea.  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  As per patient she had negative test on the ninth and had a rapid test on 13 that were both negative.  Today she had a fever with temp 100.8.  She started having left lower quadrant abdominal pain.  This was associated with diarrhea but no vomiting.  She denies dysuria.  Denies chest pain or shortness of breath.  Patient has a gastroenterologist in [place] and had normal colonoscopy and endoscopy in the past. In the ED CT of the abdomen showed enteritis, patient started on Cipro and Flagyl.     Review of systems:    In addition to the HPI above,    All other systems reviewed and are negative.    Past History of the following :    Past Medical History:  Diagnosis Date   Anemia    Depression    Hypertension    Thyroid disease       Past Surgical History:  Procedure Laterality Date   TUBAL LIGATION     uterine ablasion        Social History:      Social History   Tobacco Use   Smoking status: Never Smoker   Smokeless tobacco: Never Used  Substance Use Topics   Alcohol use: Yes    Comment: socially       Family History :   No family history of CAD   Home Medications:   Prior to Admission medications   Medication Sig Start Date End Date Taking? Authorizing Provider  ALPRAZolam Duanne Moron) 0.5 MG tablet Take 0.25 mg  by mouth at bedtime as needed for sleep.    [provider]  amLODipine (NORVASC) 10 MG tablet  01/10/18   [provider]  dicyclomine (BENTYL) 20 MG tablet  02/13/18   [provider]  hydrochlorothiazide (HYDRODIURIL) 25 MG tablet Take 25 mg by mouth daily.    [provider]  HYDROcodone-acetaminophen (NORCO/VICODIN) 5-325 MG per tablet Take 1-2 tablets by mouth every 6 (six) hours as needed for pain. 10/09/12   Fredia Sorrow, MD  ketorolac (TORADOL) 10 MG tablet Take 1 tablet (10 mg total) by mouth every 8 (eight) hours as needed. Patient not taking: Reported on 04/17/2018 01/02/18   Florian Buff, MD  levothyroxine (SYNTHROID, LEVOTHROID) 175 MCG tablet Take 288 mcg by mouth daily before breakfast.     [provider]  lisdexamfetamine (VYVANSE) 50 MG capsule Take 50 mg by mouth every morning.  [provider]  megestrol (MEGACE) 40 MG tablet 1 tablet daily 01/02/18   Florian Buff, MD  metroNIDAZOLE (METROGEL VAGINAL) 0.75 % vaginal gel Nightly x 5 nights 04/17/18   Florian Buff, MD  naproxen (NAPROSYN) 500 MG tablet Take 1 po BID with food prn pain 12/31/17   Rolland Porter, MD  nebivolol (BYSTOLIC) 10 MG tablet Take 10 mg by mouth daily.    [provider]  ondansetron (ZOFRAN) 4 MG tablet Take 1 tablet (4 mg total) by mouth every 8 (eight) hours as needed for nausea or vomiting. Patient not taking: Reported on 01/02/2018 12/31/17   Rolland Porter, MD  promethazine (PHENERGAN) 25 MG tablet Take 1 tablet (25 mg total) by mouth every 6 (six) hours as needed for nausea. Patient not taking: Reported on 01/02/2018 10/09/12   Fredia Sorrow, MD  Vitamin D, Ergocalciferol, (DRISDOL) 50000 UNITS CAPS Take 50,000 Units by mouth every 7 (seven) days. Takes on Monday.    [provider]     Allergies:    No Known Allergies   Physical Exam:   Vitals  Blood pressure (!) 182/104, pulse (!) 105, temperature (!) 100.9 F (38.3 C),  temperature source Oral, resp. rate 18, height 5\' 1"  (1.549 m), weight 104.3 kg, SpO2 95 %.  1.  General: Appears in no acute distress  2. Psychiatric: Alert, oriented x3, intact insight and judgment  3. Neurologic: Cranial nerves II through XII grossly intact, no focal deficit noted  4. HEENMT:  Atraumatic normocephalic, extraocular muscles are intact  5. Respiratory : Clear to auscultation bilaterally  6. Cardiovascular : S1, S2, regular, no murmur auscultated  7. Gastrointestinal:  Abdomen is soft, generalized tenderness to palpation, no rigidity or guarding      Data Review:    CBC Recent Labs  Lab 05/12/19 0040  WBC 8.2  HGB 12.0  HCT 38.3  PLT 205  MCV 87.6  MCH 27.5  MCHC 31.3  RDW 14.2  LYMPHSABS 0.5*  MONOABS 0.5  EOSABS 0.0  BASOSABS 0.0   ------------------------------------------------------------------------------------------------------------------  Results for orders placed or performed during the hospital encounter of 05/11/19 (from the past 48 hour(s))  Comprehensive metabolic panel     Status: Abnormal   Collection Time: 05/12/19 12:40 AM  Result Value Ref Range   Sodium 137 135 - 145 mmol/L   Potassium 3.4 (L) 3.5 - 5.1 mmol/L   Chloride 104 98 - 111 mmol/L   CO2 23 22 - 32 mmol/L   Glucose, Bld 131 (H) 70 - 99 mg/dL   BUN 10 6 - 20 mg/dL   Creatinine, Ser 0.84 0.44 - 1.00 mg/dL   Calcium 8.2 (L) 8.9 - 10.3 mg/dL   Total Protein 8.2 (H) 6.5 - 8.1 g/dL   Albumin 4.0 3.5 - 5.0 g/dL   AST 16 15 - 41 U/L   ALT 13 0 - 44 U/L   Alkaline Phosphatase 55 38 - 126 U/L   Total Bilirubin 0.8 0.3 - 1.2 mg/dL   GFR calc non Af Amer >60 >60 mL/min   GFR calc Af Amer >60 >60 mL/min   Anion gap 10 5 - 15    Comment: Performed at [facility], [address], [place], [place] [address]  Lipase, blood     Status: None   Collection Time: 05/12/19 12:40 AM  Result Value Ref Range   Lipase 17 11 - 51 U/L    Comment: Performed at [facility], [address], [place],  [place] [address]  CBC with Differential     Status: Abnormal   Collection Time: 05/12/19 12:40 AM  Result Value Ref Range   WBC 8.2 4.0 - 10.5 K/uL   RBC 4.37 3.87 - 5.11 MIL/uL   Hemoglobin 12.0 12.0 - 15.0 g/dL   HCT 38.3 36.0 - 46.0 %   MCV 87.6 80.0 - 100.0 fL   MCH 27.5 26.0 - 34.0 pg   MCHC 31.3 30.0 - 36.0 g/dL   RDW 14.2 11.5 - 15.5 %   Platelets 205 150 - 400 K/uL   nRBC 0.0 0.0 - 0.2 %   Neutrophils Relative % 87 %   Neutro Abs 7.2 1.7 - 7.7 K/uL   Lymphocytes Relative 6 %   Lymphs Abs 0.5 (L) 0.7 - 4.0 K/uL   Monocytes Relative 6 %   Monocytes Absolute 0.5 0.1 - 1.0 K/uL   Eosinophils Relative 0 %   Eosinophils Absolute 0.0 0.0 - 0.5 K/uL   Basophils Relative 0 %   Basophils Absolute 0.0 0.0 - 0.1 K/uL   Immature Granulocytes 1 %   Abs Immature Granulocytes 0.04 0.00 - 0.07 K/uL    Comment: Performed at [facility], [address] [place], [place] [address]  Urinalysis, Routine w reflex microscopic     Status: Abnormal   Collection Time: 05/12/19  3:41 AM  Result Value Ref Range   Color, Urine YELLOW YELLOW   APPearance CLEAR CLEAR   Specific Gravity, Urine >1.046 (H) 1.005 - 1.030   pH 5.0 5.0 - 8.0   Glucose, UA NEGATIVE NEGATIVE mg/dL   Hgb urine dipstick NEGATIVE NEGATIVE   Bilirubin Urine NEGATIVE NEGATIVE   Ketones, ur 20 (A) NEGATIVE mg/dL   Protein, ur 30 (A) NEGATIVE mg/dL   Nitrite NEGATIVE NEGATIVE   Leukocytes,Ua NEGATIVE NEGATIVE   RBC / HPF 0-5 0 - 5 RBC/hpf   WBC, UA 0-5 0 - 5 WBC/hpf   Bacteria, UA NONE SEEN NONE SEEN   Mucus PRESENT     Comment: Performed at [facility], [address], [place], [place] [address]  Pregnancy, urine     Status: None   Collection Time: 05/12/19  3:41 AM  Result Value Ref Range   Preg Test, Ur NEGATIVE NEGATIVE    Comment:        THE SENSITIVITY OF THIS METHODOLOGY IS >20 mIU/mL. Performed at [facility], [address], [place],  [address]     Chemistries  Recent  Labs  Lab 05/12/19 0040  NA 137  K 3.4*  CL 104  CO2 23  GLUCOSE 131*  BUN 10  CREATININE 0.84  CALCIUM 8.2*  AST 16  ALT 13  ALKPHOS 55  BILITOT 0.8   ------------------------------------------------------------------------------------------------------------------  ------------------------------------------------------------------------------------------------------------------ GFR: Estimated Creatinine Clearance: 90 mL/min (by C-G formula based on SCr of 0.84 mg/dL). Liver Function Tests: Recent Labs  Lab 05/12/19 0040  AST 16  ALT 13  ALKPHOS 55  BILITOT 0.8  PROT 8.2*  ALBUMIN 4.0   Recent Labs  Lab 05/12/19 0040  LIPASE 17    --------------------------------------------------------------------------------------------------------------- Urine analysis:    Component Value Date/Time   COLORURINE YELLOW 05/12/2019 0341   APPEARANCEUR CLEAR 05/12/2019 0341   LABSPEC >1.046 (H) 05/12/2019 0341   PHURINE 5.0 05/12/2019 0341   GLUCOSEU NEGATIVE 05/12/2019 0341   HGBUR NEGATIVE 05/12/2019 0341   BILIRUBINUR NEGATIVE 05/12/2019 0341   KETONESUR 20 (A) 05/12/2019 0341   PROTEINUR 30 (A) 05/12/2019 0341   UROBILINOGEN 1.0 10/09/2012 0820  NITRITE NEGATIVE 05/12/2019 0341   LEUKOCYTESUR NEGATIVE 05/12/2019 0341      Imaging Results:    Ct Abdomen Pelvis W Contrast  Result Date: 05/12/2019 CLINICAL DATA:  Generalized abdominal pain. Nausea. Diverticulitis suspected. EXAM: CT ABDOMEN AND PELVIS WITH CONTRAST TECHNIQUE: Multidetector CT imaging of the abdomen and pelvis was performed using the standard protocol following bolus administration of intravenous contrast. CONTRAST:  167mL OMNIPAQUE IOHEXOL 300 MG/ML  SOLN COMPARISON:  CT 12/31/2017 FINDINGS: Lower chest: Linear and bandlike atelectasis or scarring in both lung bases. No pleural fluid. Hepatobiliary: No focal liver abnormality is seen. No gallstones, gallbladder wall thickening, or biliary dilatation.  Pancreas: No ductal dilatation or inflammation. Spleen: Normal in size without focal abnormality. Adrenals/Urinary Tract: Normal adrenal glands. No hydronephrosis or perinephric edema. Homogeneous renal enhancement with symmetric excretion on delayed phase imaging. Nonobstructing stones in the lower left kidney, largest measuring 10 mm. Punctate nonobstructing stone in the mid right kidney. Tiny cortical hypodensity in the left mid kidney is too small to characterize. Urinary bladder is physiologically distended without wall thickening. Stomach/Bowel: Small hiatal hernia. Stomach otherwise decompressed. Fluid-filled nondilated small bowel in the lower abdomen. Suggestion of mild small bowel wall thickening and perienteric inflammation in the left lower quadrant, series 4, image 62. No obstruction. Normal appendix. Small volume of colonic stool. No colonic wall thickening or inflammation. No significant diverticular disease. Vascular/Lymphatic: Abdominal aorta is normal in caliber. Portal vein is patent. Prominent 9 mm left external iliac node series 4, image 69. No upper abdominal adenopathy. Reproductive: Enlarged uterus with ill-defined fluid/low-density in the endometrial canal. Degree of fluid is increased from prior CT. Slight soft tissue prominence in the cervix. No definite adnexal mass. Other: Small amount of free fluid in the pelvis, slightly increased from prior. No upper abdominal ascites. Supraumbilical ventral abdominal wall hernia contains fat, previous transverse colon involvement has resolved. Additional complex umbilical and supraumbilical fat containing abdominal wall hernias. No bowel involvement or inflammation. Musculoskeletal: Degenerative change in the lumbar spine with primarily facet hypertrophy. Stable bone island in the right proximal femur. There are no acute or suspicious osseous abnormalities. IMPRESSION: 1. Fluid-filled small bowel with short segment small bowel wall thickening or  perienteric stranding, suggesting enteritis. This may be infectious or inflammatory. No bowel obstruction. 2. Enlarged uterus with ill-defined fluid/low-density in the endometrial canal. The degree of endometrial fluid has increased from CT 16 months prior. Well endometrial findings may be related to prior endometrial ablation, recommend nonemergent pelvic ultrasound for further characterization to assess for endometrial thickening. 3. Nonobstructing bilateral nephrolithiasis. 4. Multiple ventral abdominal wall hernias containing fat, no bowel involvement or inflammation. Electronically Signed   By: Keith Rake M.D.   On: 05/12/2019 01:46       Assessment & Plan:    Active Problems:   Enteritis   1. Enteritis-CT abdomen pelvis shows small bowel thickening and perienteric stranding suggesting enteritis.  We will keep patient n.p.o., start IV Cipro and Flagyl.  Fentanyl 50 mcg every 4 hours as needed for pain.  Normal saline +20 mEq KCl at 100 mill per hour.  Follow BMP in a.m.  2. Hypertension-blood pressure is elevated, continue home medication including hydrochlorothiazide, amlodipine, Bystolic.  3. Hypothyroidism-continue Synthroid.  4. Hypokalemia-potassium is 3.4, started on IV normal saline plus KCl as above.  Follow BMP in a.m.    DVT Prophylaxis-   Lovenox   AM Labs Ordered, also please review Full Orders  Family Communication: Admission, patients condition and plan of care  including tests being ordered have been discussed with the patient who indicate understanding and agree with the plan and Code Status.  Code Status: Full code  Admission status: Observation: Based on patients clinical presentation and evaluation of above clinical data, I have made determination that patient meets Inpatient criteria at this time.  Time spent in minutes : 60 minutes   Oswald Hillock M.D on 05/12/2019 at 5:02 AM

## 2019-05-12 NOTE — ED Provider Notes (Signed)
[facility] EMERGENCY DEPARTMENT Provider Note   CSN: [id number] Arrival date & time: 05/11/19  1909   Time seen 12:15 AM  History   Chief Complaint Chief Complaint  Patient presents with  . Abdominal Pain    HPI Karen Cruz is a 48 y.o. female.     HPI patient states she had flulike symptoms starting on November 11 with headache, chills, myalgias, sore throat, fever off and on and rhinorrhea.  She works in the [employer] and she gets regular Covid testing.  She states she was negative on the ninth and had a rapid test on the 13th that were both negative.  She states her body aches have continued and her fever was down to 99 however today it went up to 100.8.  She states she started having left lower quadrant pain on the 14th and now it is spreading to be bilateral lower abdominal pain and going into her lower back.  She states it is a constant pain and sharp.  She also has cramping in her upper abdomen.  She states any type of movement and eating food makes the pain worse.  Ibuprofen was making the pain better but not now.  She has decreased appetite and also states if she eats it makes her stomach hurt more.  She has had nausea without vomiting, and she had 5 episodes of loose stool yesterday and 3 today.  She denies seeing any blood.  She denies any urinary symptoms such as dysuria or frequency.  She states she has had this pain before but not as bad as this.  She states normally Motrin makes it feel better.  She states she has a gastroenterologist in [place] and she has had a normal colonoscopy and endoscopy in the past.  PCP Guy Begin, FNP  Past Medical History:  Diagnosis Date  . Anemia   . Depression   . Hypertension   . Thyroid disease     There are no active problems to display for this patient.   Past Surgical History:  Procedure Laterality Date  . TUBAL LIGATION    . uterine ablasion       OB History    Gravida  4   Para  3   Term  3   Preterm      AB  1   Living  3     SAB      TAB      Ectopic      Multiple      Live Births               Home Medications    Prior to Admission medications   Medication Sig Start Date End Date Taking? Authorizing Provider  ALPRAZolam Duanne Moron) 0.5 MG tablet Take 0.25 mg by mouth at bedtime as needed for sleep.    [provider]  amLODipine (NORVASC) 10 MG tablet  01/10/18   [provider]  dicyclomine (BENTYL) 20 MG tablet  02/13/18   [provider]  hydrochlorothiazide (HYDRODIURIL) 25 MG tablet Take 25 mg by mouth daily.    [provider]  HYDROcodone-acetaminophen (NORCO/VICODIN) 5-325 MG per tablet Take 1-2 tablets by mouth every 6 (six) hours as needed for pain. 10/09/12   Fredia Sorrow, MD  ketorolac (TORADOL) 10 MG tablet Take 1 tablet (10 mg total) by mouth every 8 (eight) hours as needed. Patient not taking: Reported on 04/17/2018 01/02/18   Florian Buff, MD  levothyroxine (SYNTHROID, LEVOTHROID) [phone]  MCG tablet Take 288 mcg by mouth daily before breakfast.     [provider]  lisdexamfetamine (VYVANSE) 50 MG capsule Take 50 mg by mouth every morning.    [provider]  megestrol (MEGACE) 40 MG tablet 1 tablet daily 01/02/18   Florian Buff, MD  metroNIDAZOLE (METROGEL VAGINAL) 0.75 % vaginal gel Nightly x 5 nights 04/17/18   Florian Buff, MD  naproxen (NAPROSYN) 500 MG tablet Take 1 po BID with food prn pain 12/31/17   Rolland Porter, MD  nebivolol (BYSTOLIC) 10 MG tablet Take 10 mg by mouth daily.    [provider]  ondansetron (ZOFRAN) 4 MG tablet Take 1 tablet (4 mg total) by mouth every 8 (eight) hours as needed for nausea or vomiting. Patient not taking: Reported on 01/02/2018 12/31/17   Rolland Porter, MD  promethazine (PHENERGAN) 25 MG tablet Take 1 tablet (25 mg total) by mouth every 6 (six) hours as needed for nausea. Patient not taking: Reported on 01/02/2018 10/09/12   Fredia Sorrow, MD   Vitamin D, Ergocalciferol, (DRISDOL) 50000 UNITS CAPS Take 50,000 Units by mouth every 7 (seven) days. Takes on Monday.    [provider]    Family History History reviewed. No pertinent family history.  Social History Social History   Tobacco Use  . Smoking status: Never Smoker  . Smokeless tobacco: Never Used  Substance Use Topics  . Alcohol use: Yes    Comment: socially  . Drug use: No  employed   Allergies   Patient has no known allergies.   Review of Systems Review of Systems  All other systems reviewed and are negative.    Physical Exam Updated Vital Signs BP (!) 182/104   Pulse (!) 105   Temp (!) 100.9 F (38.3 C) (Oral)   Resp 18   Ht 5\' 1"  (1.549 m)   Wt 104.3 kg   SpO2 95%   BMI 43.46 kg/m   Physical Exam Vitals signs and nursing note reviewed.  Constitutional:      General: She is in acute distress.     Appearance: Normal appearance. She is well-developed. She is obese. She is not ill-appearing or toxic-appearing.  HENT:     Head: Normocephalic and atraumatic.     Right Ear: External ear normal.     Left Ear: External ear normal.     Nose: Nose normal. No mucosal edema or rhinorrhea.     Mouth/Throat:     Mouth: Mucous membranes are dry.     Dentition: No dental abscesses.     Pharynx: No uvula swelling.  Eyes:     Extraocular Movements: Extraocular movements intact.     Conjunctiva/sclera: Conjunctivae normal.     Pupils: Pupils are equal, round, and reactive to light.  Neck:     Musculoskeletal: Full passive range of motion without pain, normal range of motion and neck supple.  Cardiovascular:     Rate and Rhythm: Normal rate and regular rhythm.     Heart sounds: Normal heart sounds. No murmur. No friction rub. No gallop.   Pulmonary:     Effort: Pulmonary effort is normal. No respiratory distress.     Breath sounds: Normal breath sounds. No wheezing, rhonchi or rales.  Chest:     Chest wall: No tenderness or crepitus.   Abdominal:     General: Bowel sounds are normal. There is no distension.     Palpations: Abdomen is soft.     Tenderness: There  is abdominal tenderness. There is guarding. There is no rebound.       Comments: Patient has diffuse pain however she is most painful in the left lower quadrant, palpation in her right mid abdomen causes the pain to radiate towards her left side.  She is not very tender however in her right lower quadrant.  Even light touch with my stethoscope seems to be painful.  Musculoskeletal: Normal range of motion.     Comments: Moves all extremities well.   Skin:    General: Skin is warm and dry.     Capillary Refill: Capillary refill takes less than 2 seconds.     Coloration: Skin is not pale.     Findings: No erythema or rash.  Neurological:     General: No focal deficit present.     Mental Status: She is alert and oriented to person, place, and time.     Cranial Nerves: No cranial nerve deficit.  Psychiatric:        Mood and Affect: Mood normal. Mood is not anxious.        Speech: Speech normal.        Behavior: Behavior normal.        Thought Content: Thought content normal.      ED Treatments / Results  Labs (all labs ordered are listed, but only abnormal results are displayed) Results for orders placed or performed during the hospital encounter of 05/11/19  Comprehensive metabolic panel  Result Value Ref Range   Sodium 137 135 - 145 mmol/L   Potassium 3.4 (L) 3.5 - 5.1 mmol/L   Chloride 104 98 - 111 mmol/L   CO2 23 22 - 32 mmol/L   Glucose, Bld 131 (H) 70 - 99 mg/dL   BUN 10 6 - 20 mg/dL   Creatinine, Ser 0.84 0.44 - 1.00 mg/dL   Calcium 8.2 (L) 8.9 - 10.3 mg/dL   Total Protein 8.2 (H) 6.5 - 8.1 g/dL   Albumin 4.0 3.5 - 5.0 g/dL   AST 16 15 - 41 U/L   ALT 13 0 - 44 U/L   Alkaline Phosphatase 55 38 - 126 U/L   Total Bilirubin 0.8 0.3 - 1.2 mg/dL   GFR calc non Af Amer >60 >60 mL/min   GFR calc Af Amer >60 >60 mL/min   Anion gap 10 5 - 15   Lipase, blood  Result Value Ref Range   Lipase 17 11 - 51 U/L  CBC with Differential  Result Value Ref Range   WBC 8.2 4.0 - 10.5 K/uL   RBC 4.37 3.87 - 5.11 MIL/uL   Hemoglobin 12.0 12.0 - 15.0 g/dL   HCT 38.3 36.0 - 46.0 %   MCV 87.6 80.0 - 100.0 fL   MCH 27.5 26.0 - 34.0 pg   MCHC 31.3 30.0 - 36.0 g/dL   RDW 14.2 11.5 - 15.5 %   Platelets 205 150 - 400 K/uL   nRBC 0.0 0.0 - 0.2 %   Neutrophils Relative % 87 %   Neutro Abs 7.2 1.7 - 7.7 K/uL   Lymphocytes Relative 6 %   Lymphs Abs 0.5 (L) 0.7 - 4.0 K/uL   Monocytes Relative 6 %   Monocytes Absolute 0.5 0.1 - 1.0 K/uL   Eosinophils Relative 0 %   Eosinophils Absolute 0.0 0.0 - 0.5 K/uL   Basophils Relative 0 %   Basophils Absolute 0.0 0.0 - 0.1 K/uL   Immature Granulocytes 1 %   Abs Immature Granulocytes 0.04  0.00 - 0.07 K/uL  Urinalysis, Routine w reflex microscopic  Result Value Ref Range   Color, Urine YELLOW YELLOW   APPearance CLEAR CLEAR   Specific Gravity, Urine >1.046 (H) 1.005 - 1.030   pH 5.0 5.0 - 8.0   Glucose, UA NEGATIVE NEGATIVE mg/dL   Hgb urine dipstick NEGATIVE NEGATIVE   Bilirubin Urine NEGATIVE NEGATIVE   Ketones, ur 20 (A) NEGATIVE mg/dL   Protein, ur 30 (A) NEGATIVE mg/dL   Nitrite NEGATIVE NEGATIVE   Leukocytes,Ua NEGATIVE NEGATIVE   RBC / HPF 0-5 0 - 5 RBC/hpf   WBC, UA 0-5 0 - 5 WBC/hpf   Bacteria, UA NONE SEEN NONE SEEN   Mucus PRESENT   Pregnancy, urine  Result Value Ref Range   Preg Test, Ur NEGATIVE NEGATIVE   Laboratory interpretation all normal except hyperglycemia    EKG None  Radiology Ct Abdomen Pelvis W Contrast  Result Date: 05/12/2019 CLINICAL DATA:  Generalized abdominal pain. Nausea. Diverticulitis suspected. EXAM: CT ABDOMEN AND PELVIS WITH CONTRAST TECHNIQUE: Multidetector CT imaging of the abdomen and pelvis was performed using the standard protocol following bolus administration of intravenous contrast. CONTRAST:  153mL OMNIPAQUE IOHEXOL 300 MG/ML  SOLN  COMPARISON:  CT 12/31/2017 FINDINGS: Lower chest: Linear and bandlike atelectasis or scarring in both lung bases. No pleural fluid. Hepatobiliary: No focal liver abnormality is seen. No gallstones, gallbladder wall thickening, or biliary dilatation. Pancreas: No ductal dilatation or inflammation. Spleen: Normal in size without focal abnormality. Adrenals/Urinary Tract: Normal adrenal glands. No hydronephrosis or perinephric edema. Homogeneous renal enhancement with symmetric excretion on delayed phase imaging. Nonobstructing stones in the lower left kidney, largest measuring 10 mm. Punctate nonobstructing stone in the mid right kidney. Tiny cortical hypodensity in the left mid kidney is too small to characterize. Urinary bladder is physiologically distended without wall thickening. Stomach/Bowel: Small hiatal hernia. Stomach otherwise decompressed. Fluid-filled nondilated small bowel in the lower abdomen. Suggestion of mild small bowel wall thickening and perienteric inflammation in the left lower quadrant, series 4, image 62. No obstruction. Normal appendix. Small volume of colonic stool. No colonic wall thickening or inflammation. No significant diverticular disease. Vascular/Lymphatic: Abdominal aorta is normal in caliber. Portal vein is patent. Prominent 9 mm left external iliac node series 4, image 69. No upper abdominal adenopathy. Reproductive: Enlarged uterus with ill-defined fluid/low-density in the endometrial canal. Degree of fluid is increased from prior CT. Slight soft tissue prominence in the cervix. No definite adnexal mass. Other: Small amount of free fluid in the pelvis, slightly increased from prior. No upper abdominal ascites. Supraumbilical ventral abdominal wall hernia contains fat, previous transverse colon involvement has resolved. Additional complex umbilical and supraumbilical fat containing abdominal wall hernias. No bowel involvement or inflammation. Musculoskeletal: Degenerative change in  the lumbar spine with primarily facet hypertrophy. Stable bone island in the right proximal femur. There are no acute or suspicious osseous abnormalities. IMPRESSION: 1. Fluid-filled small bowel with short segment small bowel wall thickening or perienteric stranding, suggesting enteritis. This may be infectious or inflammatory. No bowel obstruction. 2. Enlarged uterus with ill-defined fluid/low-density in the endometrial canal. The degree of endometrial fluid has increased from CT 16 months prior. Well endometrial findings may be related to prior endometrial ablation, recommend nonemergent pelvic ultrasound for further characterization to assess for endometrial thickening. 3. Nonobstructing bilateral nephrolithiasis. 4. Multiple ventral abdominal wall hernias containing fat, no bowel involvement or inflammation. Electronically Signed   By: Keith Rake M.D.   On: 05/12/2019 01:46  Procedures Procedures (including critical care time)  Medications Ordered in ED Medications  metroNIDAZOLE (FLAGYL) IVPB 500 mg (has no administration in time range)  ciprofloxacin (CIPRO) IVPB 400 mg (400 mg Intravenous New Bag/Given 05/12/19 0411)  sodium chloride 0.9 % bolus 1,000 mL (0 mLs Intravenous Stopped 05/12/19 0425)  sodium chloride 0.9 % bolus 500 mL (0 mLs Intravenous Stopped 05/12/19 0425)  fentaNYL (SUBLIMAZE) injection 50 mcg (50 mcg Intravenous Given 05/12/19 0103)  ondansetron (ZOFRAN) injection 4 mg (4 mg Intravenous Given 05/12/19 0103)  iohexol (OMNIPAQUE) 300 MG/ML solution 100 mL (100 mLs Intravenous Contrast Given 05/12/19 0125)  fentaNYL (SUBLIMAZE) injection 50 mcg (50 mcg Intravenous Given 05/12/19 0408)     Initial Impression / Assessment and Plan / ED Course  I have reviewed the triage vital signs and the nursing notes.  Pertinent labs & imaging results that were available during my care of the patient were reviewed by me and considered in my medical decision making (see chart for  details).       Patient was given IV fluids, IV pain and nausea medication.  Laboratory testing and CT of the abdomen was done to further evaluate her pain.  We discussed her pain was consistent with diverticulitis or could be a kidney stone.  When I reviewed her CT scan she was started on IV Cipro and IV Flagyl.  Recheck at 4:00 AM patient states her pain starting to return.  We discussed her CT results.  I asked her if she felt like she can go home and take oral pills or she felt like she needs to stay in the hospital and stay on IV medication she states she does not feel like she can go home.    4:29 AM patient was discussed with Dr. Darrick Meigs, hospitalist, he will admit.   Final Clinical Impressions(s) / ED Diagnoses   Final diagnoses:  Enteritis  Left lower quadrant abdominal pain  Diarrhea, unspecified type    Plan admission  Rolland Porter, MD, Barbette Or, MD 05/12/19 [phone]

## 2019-05-13 DIAGNOSIS — U071 COVID-19: Principal | ICD-10-CM

## 2019-05-13 DIAGNOSIS — I10 Essential (primary) hypertension: Secondary | ICD-10-CM

## 2019-05-13 LAB — HIV ANTIBODY (ROUTINE TESTING W REFLEX): HIV Screen 4th Generation wRfx: NONREACTIVE — AB

## 2019-05-13 LAB — SAMPLE TO BLOOD BANK

## 2019-05-13 LAB — PROCALCITONIN: Procalcitonin: 0.59 ng/mL

## 2019-05-13 MED ORDER — LISDEXAMFETAMINE DIMESYLATE 50 MG PO CAPS
50.0000 mg | ORAL_CAPSULE | Freq: Every day | ORAL | Status: DC
  Filled 2019-05-13 (×2): qty 1

## 2019-05-13 MED ORDER — PANTOPRAZOLE SODIUM 40 MG PO TBEC
40.0000 mg | DELAYED_RELEASE_TABLET | Freq: Two times a day (BID) | ORAL | Status: DC
  Administered 2019-05-13 – 2019-05-16 (×7): 40 mg via ORAL
  Filled 2019-05-13 (×7): qty 1

## 2019-05-13 MED ORDER — POTASSIUM CHLORIDE CRYS ER 20 MEQ PO TBCR
40.0000 meq | EXTENDED_RELEASE_TABLET | Freq: Three times a day (TID) | ORAL | Status: AC
  Administered 2019-05-13 (×3): 40 meq via ORAL
  Filled 2019-05-13 (×3): qty 2

## 2019-05-13 MED ORDER — POTASSIUM CHLORIDE 2 MEQ/ML IV SOLN: INTRAVENOUS | Status: DC

## 2019-05-13 MED ORDER — KCL IN DEXTROSE-NACL 40-5-0.45 MEQ/L-%-% IV SOLN
INTRAVENOUS | Status: DC
  Administered 2019-05-13 – 2019-05-15 (×4): via INTRAVENOUS
  Filled 2019-05-13 (×3): qty 1000

## 2019-05-13 NOTE — Progress Notes (Signed)
Patient declined to have family called and updated.

## 2019-05-13 NOTE — Progress Notes (Signed)
Initial Nutrition Assessment RD working remotely.  DOCUMENTATION CODES:   Morbid obesity  INTERVENTION:   When diet advanced, add PO supplements as needed.   NUTRITION DIAGNOSIS:   Increased nutrient needs related to acute illness(COVID 19) as evidenced by estimated needs.  GOAL:   Patient will meet greater than or equal to 90% of their needs  MONITOR:   PO intake, Supplement acceptance, Labs  REASON FOR ASSESSMENT:   Malnutrition Screening Tool    ASSESSMENT:   48 yo female admitted with abdominal pain; CT scan showed fluid filled small bowel with short segment small bowel fall thickening. COVID 19 positive. She works in a [employer]. PMH includes anemia, HTN, thyroid disease, depression.   Patient is currently NPO due to ongoing abdominal pain with acute COVID enteritis. Intake has been poor for a few days PTA. Patient has had some water to drink today and has not had any more vomiting.  Weight encounters reviewed. Patient has lost 3.5% of usual weight within the past year, which is not significant for the time frame.   Labs reviewed.  Medications reviewed and include ferrous sulfate, KCl..   NUTRITION - FOCUSED PHYSICAL EXAM:  deferred  Diet Order:   Diet Order            Diet NPO time specified Except for: Sips with Meds  Diet effective now              EDUCATION NEEDS:   Not appropriate for education at this time  Skin:  Skin Assessment: Reviewed RN Assessment  Last BM:  11/17  Height:   Ht Readings from Last 1 Encounters:  05/11/19 5\' 1"  (1.549 m)    Weight:   Wt Readings from Last 1 Encounters:  05/11/19 104.3 kg    Ideal Body Weight:  47.7 kg  BMI:  Body mass index is 43.46 kg/m.  Estimated Nutritional Needs:   Kcal:  [id number]  Protein:  105-125  gm  Fluid:  >/= 1.9 L    Molli Barrows, RD, LDN, [place] Pager [phone] After Hours Pager [phone]

## 2019-05-13 NOTE — Progress Notes (Signed)
[facility] PROGRESS NOTE    Progress Note  Karen Cruz  [id number] DOB: [birth year] DOA: 05/11/2019 PCP: Guy Begin, FNP     Brief Narrative:   Karen Cruz is an 48 y.o. 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  She denies chest pain or shortness of breath.  CT of the abdomen Fluid-filled small bowel with short segment small bowel wall thickening or perienteric stranding.  Assessment/Plan:   COVID-19 Enteritis/Diarrhea and abdominal pain: CT scan with results as above. Keep the patient n.p.o as she is tender mostly in the epigastric area..  She has no leukocytosis is now afebrile. Started on IV Cipro and Flagyl on admission. Her procalcitonin 0.5 we will discontinue IV empiric antibiotics as she does no cytosis and her procalcitonin is low which makes bacterial infection low yield. Change her IV fluids to D5 half-normal with potassium supplementation. Denies any chest pain shortness of breath, her saturations have been greater than 93% on room air. Continue IV fluids at her specific gravity on UA was 1046.  Hypokalemia: Likely due to diarrhea replete orally recheck in the morning.  Hypothyroidism: Continue Synthroid.  Essential hypertension: Continue hydrochlorothiazide, amlodipine and bisoprolol.  Endometrial abnormality: CT scan of the abdomen pelvis on 05/11/2019 showed an enlarged uterus and ill-defined with an increase of endometrial fluid compared to CT scan 16 months prior.  DVT prophylaxis: lovenox Family Communication:none Disposition Plan/Barrier to D/C: Unable to determine. Code Status:     Code Status Orders  (From admission, onward)         Start     Ordered   05/12/19 0702  Full code  Continuous     05/12/19 0701        Code Status History    This patient has a current code status but no historical code status.   Advance Care Planning Activity        IV Access:    Peripheral IV   Procedures and diagnostic studies:   Ct Abdomen Pelvis W Contrast  Result Date: 05/12/2019 CLINICAL DATA:  Generalized abdominal pain. Nausea. Diverticulitis suspected. EXAM: CT ABDOMEN AND PELVIS WITH CONTRAST TECHNIQUE: Multidetector CT imaging of the abdomen and pelvis was performed using the standard protocol following bolus administration of intravenous contrast. CONTRAST:  180mL OMNIPAQUE IOHEXOL 300 MG/ML  SOLN COMPARISON:  CT 12/31/2017 FINDINGS: Lower chest: Linear and bandlike atelectasis or scarring in both lung bases. No pleural fluid. Hepatobiliary: No focal liver abnormality is seen. No gallstones, gallbladder wall thickening, or biliary dilatation. Pancreas: No ductal dilatation or inflammation. Spleen: Normal in size without focal abnormality. Adrenals/Urinary Tract: Normal adrenal glands. No hydronephrosis or perinephric edema. Homogeneous renal enhancement with symmetric excretion on delayed phase imaging. Nonobstructing stones in the lower left kidney, largest measuring 10 mm. Punctate nonobstructing stone in the mid right kidney. Tiny cortical hypodensity in the left mid kidney is too small to characterize. Urinary bladder is physiologically distended without wall thickening. Stomach/Bowel: Small hiatal hernia. Stomach otherwise decompressed. Fluid-filled nondilated small bowel in the lower abdomen. Suggestion of mild small bowel wall thickening and perienteric inflammation in the left lower quadrant, series 4, image 62. No obstruction. Normal appendix. Small volume of colonic stool. No colonic wall thickening or inflammation. No significant diverticular disease. Vascular/Lymphatic: Abdominal aorta  is normal in caliber. Portal vein is patent. Prominent  9 mm left external iliac node series 4, image 69. No upper abdominal adenopathy. Reproductive: Enlarged uterus with ill-defined fluid/low-density in the endometrial canal. Degree of fluid is increased from prior CT. Slight soft tissue prominence in the cervix. No definite adnexal mass. Other: Small amount of free fluid in the pelvis, slightly increased from prior. No upper abdominal ascites. Supraumbilical ventral abdominal wall hernia contains fat, previous transverse colon involvement has resolved. Additional complex umbilical and supraumbilical fat containing abdominal wall hernias. No bowel involvement or inflammation. Musculoskeletal: Degenerative change in the lumbar spine with primarily facet hypertrophy. Stable bone island in the right proximal femur. There are no acute or suspicious osseous abnormalities. IMPRESSION: 1. Fluid-filled small bowel with short segment small bowel wall thickening or perienteric stranding, suggesting enteritis. This may be infectious or inflammatory. No bowel obstruction. 2. Enlarged uterus with ill-defined fluid/low-density in the endometrial canal. The degree of endometrial fluid has increased from CT 16 months prior. Well endometrial findings may be related to prior endometrial ablation, recommend nonemergent pelvic ultrasound for further characterization to assess for endometrial thickening. 3. Nonobstructing bilateral nephrolithiasis. 4. Multiple ventral abdominal wall hernias containing fat, no bowel involvement or inflammation. Electronically Signed   By: Keith Rake M.D.   On: 05/12/2019 01:46   Dg Chest Port 1 View  Result Date: 05/12/2019 CLINICAL DATA:  COVID positive EXAM: PORTABLE CHEST 1 VIEW COMPARISON:  07/21/2016 FINDINGS: Streaky atelectasis at the bases. No consolidation. Normal heart size. No pneumothorax. IMPRESSION: Linear opacity at both lung bases suggestive of atelectasis. Electronically Signed   By: Donavan Foil M.D.   On: 05/12/2019 22:17      Medical Consultants:    None.  Anti-Infectives:   Ciprofloxacin and Flagyl.  Subjective:    Karen Cruz relates she vomited twice yesterday.  She has vomited none today was drinking water this morning without any difficulties.  Objective:    Vitals:   05/12/19 1633 05/12/19 2140 05/13/19 0050 05/13/19 0434  BP: (!) 144/80 137/87 140/88 138/74  Pulse: (!) 106 97 93 82  Resp: 20 20 18 18   Temp:  99.7 F (37.6 C) 99.6 F (37.6 C) 99.2 F (37.3 C)  TempSrc:   Oral Oral  SpO2: 95% 93% 92% 95%  Weight:      Height:       SpO2: 95 %   Intake/Output Summary (Last 24 hours) at 05/13/2019 0729 Last data filed at 05/13/2019 0400 Gross per 24 hour  Intake 1950.77 ml  Output --  Net 1950.77 ml   Filed Weights   05/11/19 2003  Weight: 104.3 kg    Exam: General exam: In no acute distress. Respiratory system: Good air movement and clear to auscultation. Cardiovascular system: S1 & S2 heard, RRR.   Gastrointestinal system: Positive bowel sounds with epigastric tenderness to deep palpation no rebound or guarding. Central nervous system: Alert and oriented. No focal neurological deficits. Extremities: No pedal edema. Skin: No rashes, lesions or ulcers Psychiatry: Judgement and insight appear normal. Mood & affect appropriate.    Data Reviewed:    Labs: Basic Metabolic Panel: Recent Labs  Lab 05/12/19 0040 05/12/19 0815  NA 137 138  K 3.4* 3.3*  CL 104 105  CO2 23 24  GLUCOSE 131* 112*  BUN 10 8  CREATININE 0.84 0.85  CALCIUM 8.2* 7.6*   GFR Estimated Creatinine Clearance: 89 mL/min (by C-G formula based on SCr of 0.85 mg/dL). Liver Function Tests: Recent  Labs  Lab 05/12/19 0040 05/12/19 0815  AST 16 14*  ALT 13 12  ALKPHOS 55 48  BILITOT 0.8 0.8  PROT 8.2* 7.5  ALBUMIN 4.0 3.6   Recent Labs  Lab 05/12/19 0040  LIPASE 17   No results for input(s): AMMONIA in the last 168 hours. Coagulation profile No results for input(s): INR,  PROTIME in the last 168 hours. COVID-19 Labs  No results for input(s): DDIMER, FERRITIN, LDH, CRP in the last 72 hours.  Lab Results  Component Value Date   SARSCOV2NAA POSITIVE (A) 05/12/2019    CBC: Recent Labs  Lab 05/12/19 0040 05/12/19 0815  WBC 8.2 7.9  NEUTROABS 7.2  --   HGB 12.0 11.4*  HCT 38.3 36.5  MCV 87.6 88.6  PLT 205 194   Cardiac Enzymes: No results for input(s): CKTOTAL, CKMB, CKMBINDEX, TROPONINI in the last 168 hours. BNP (last 3 results) No results for input(s): PROBNP in the last 8760 hours. CBG: No results for input(s): GLUCAP in the last 168 hours. D-Dimer: No results for input(s): DDIMER in the last 72 hours. Hgb A1c: No results for input(s): HGBA1C in the last 72 hours. Lipid Profile: No results for input(s): CHOL, HDL, LDLCALC, TRIG, CHOLHDL, LDLDIRECT in the last 72 hours. Thyroid function studies: No results for input(s): TSH, T4TOTAL, T3FREE, THYROIDAB in the last 72 hours.  Invalid input(s): FREET3 Anemia work up: No results for input(s): VITAMINB12, FOLATE, FERRITIN, TIBC, IRON, RETICCTPCT in the last 72 hours. Sepsis Labs: Recent Labs  Lab 05/12/19 0040 05/12/19 0815  WBC 8.2 7.9   Microbiology Recent Results (from the past 240 hour(s))  SARS CORONAVIRUS 2 (TAT 6-24 HRS) Nasopharyngeal Nasopharyngeal Swab     Status: Abnormal   Collection Time: 05/12/19  4:18 AM   Specimen: Nasopharyngeal Swab  Result Value Ref Range Status   SARS Coronavirus 2 POSITIVE (A) NEGATIVE Final    Comment: (NOTE) SARS-CoV-2 target nucleic acids are DETECTED. The SARS-CoV-2 RNA is generally detectable in upper and lower respiratory specimens during the acute phase of infection. Positive results are indicative of active infection with SARS-CoV-2. Clinical  correlation with patient history and other diagnostic information is necessary to determine patient infection status. Positive results do  not rule out bacterial infection or co-infection with  other viruses. The expected result is Negative. Fact Sheet for Patients: SugarRoll.be Fact Sheet for Healthcare Providers: https://www.woods-mathews.com/ This test is not yet approved or cleared by the [place] FDA and  has been authorized for detection and/or diagnosis of SARS-CoV-2 by FDA under an Emergency Use Authorization (EUA). This EUA will remain  in effect (meaning this test can be used) for the duration of the COVID-19 declaration under Section 564(b)(1) of the Act, 21 U.S.C.  section 360bbb-3(b)(1), unless the authorization is terminated or revoked sooner. Performed at [facility], [place] [address] [place], [place] [address]      Medications:    amLODipine  10 mg Oral Daily   enoxaparin (LOVENOX) injection  40 mg Subcutaneous Q24H   feeding supplement  1 Container Oral TID BM   ferrous sulfate  325 mg Oral Q breakfast   hydrochlorothiazide  25 mg Oral Daily   levothyroxine  288 mcg Oral QAC breakfast   lisdexamfetamine  50 mg Oral Daily   nebivolol  10 mg Oral Daily   Continuous Infusions:  0.9 % NaCl with KCl 20 mEq / L 100 mL/hr at 05/13/19 0400   ciprofloxacin 400 mg (05/13/19 0543)   metronidazole  500 mg (05/13/19 0431)      LOS: 1 day   Charlynne Cousins  [facility]  05/13/2019, 7:29 AM

## 2019-05-13 NOTE — Progress Notes (Signed)
Patient deferred family update. 

## 2019-05-14 LAB — BASIC METABOLIC PANEL
Anion gap: 11 (ref 5–15)
BUN: 6 mg/dL (ref 6–20)
CO2: 22 mmol/L (ref 22–32)
Calcium: 8.5 mg/dL — ABNORMAL LOW (ref 8.9–10.3)
Chloride: 103 mmol/L (ref 98–111)
Creatinine, Ser: 0.7 mg/dL (ref 0.44–1.00)
GFR calc Af Amer: >60 mL/min (ref >60)
GFR calc non Af Amer: >60 mL/min (ref >60)
Glucose, Bld: 82 mg/dL (ref 70–99)
Potassium: 4.4 mmol/L (ref 3.5–5.1)
Sodium: 136 mmol/L (ref 135–145)

## 2019-05-14 LAB — PROCALCITONIN: Procalcitonin: 0.32 ng/mL

## 2019-05-14 LAB — D-DIMER, QUANTITATIVE: D-Dimer, Quant: 7.07 ug/mL-FEU — ABNORMAL HIGH (ref 0.00–0.50)

## 2019-05-14 LAB — C-REACTIVE PROTEIN: CRP: 13.4 mg/dL — ABNORMAL HIGH (ref <1.0)

## 2019-05-14 NOTE — Progress Notes (Signed)
Pt deferred nurse update as she is in regular contact with her family. She agreed to pass along any questions as they may arise.

## 2019-05-14 NOTE — Progress Notes (Signed)
[facility] PROGRESS NOTE    Progress Note  Giyanna Ballo  [id number] DOB: [birth year] DOA: 05/11/2019 PCP: Guy Begin, FNP     Brief Narrative:   Yurico Basnett is an 48 y.o. 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  She denies chest pain or shortness of breath.  CT of the abdomen Fluid-filled small bowel with short segment small bowel wall thickening or perienteric stranding.  Assessment/Plan:   COVID-19 Enteritis/Diarrhea and abdominal pain: CT scan done on 05/11/2019 show thickening of small bowel with perienteric stranding. She was started empirically on IV Flagyl and Cipro, she did not have leukocytosis and procalcitonin was low these were held. Continues to be low which makes a bacterial infection unlikely. She is about 5 L positive will slow down IV fluids.  Follow strict I's and O's. She denies any chest pain or shortness of breath. Her nausea and vomiting are improved she would like to try diet today.  Hypokalemia: Likely due to diarrhea, she relates her diarrhea has improved today. Potassium is improved today.  Hypothyroidism: Continue Synthroid.  Essential hypertension: Continue hydrochlorothiazide, amlodipine and bisoprolol.  Endometrial abnormality: CT scan of the abdomen pelvis on 05/11/2019 showed an enlarged uterus and ill-defined with an increase of endometrial fluid compared to CT scan 16 months prior. Other evaluation by her GYN as an outpatient.  DVT prophylaxis: lovenox Family Communication:none Disposition Plan/Barrier to D/C: Unable to determine. Code Status:     Code Status Orders  (From admission, onward)         Start     Ordered   05/12/19 0702  Full code   Continuous     05/12/19 0701        Code Status History    This patient has a current code status but no historical code status.   Advance Care Planning Activity        IV Access:    Peripheral IV   Procedures and diagnostic studies:   Dg Chest Port 1 View  Result Date: 05/12/2019 CLINICAL DATA:  COVID positive EXAM: PORTABLE CHEST 1 VIEW COMPARISON:  07/21/2016 FINDINGS: Streaky atelectasis at the bases. No consolidation. Normal heart size. No pneumothorax. IMPRESSION: Linear opacity at both lung bases suggestive of atelectasis. Electronically Signed   By: Donavan Foil M.D.   On: 05/12/2019 22:17     Medical Consultants:    None.  Anti-Infectives:   Ciprofloxacin and Flagyl.  Subjective:    Merina Miyoshi no further vomiting she would like to try a clear liquid diet.  Objective:    Vitals:   05/13/19 0806 05/13/19 1659 05/13/19 2030 05/14/19 0400  BP: 124/72 (!) 131/91 (!) 150/93 136/79  Pulse: 92 96 89 84  Resp: 20 18 18    Temp: 99.5 F (37.5 C) 98.7 F (37.1 C) 100.2 F (37.9 C) 98.6 F (37 C)  TempSrc: Oral Oral Oral Oral  SpO2: 96% 94% 90% 93%  Weight:      Height:       SpO2: 93 %   Intake/Output Summary (Last 24 hours) at 05/14/2019 0731 Last data filed at 05/13/2019 2300 Gross per 24 hour  Intake 1578.63 ml  Output --  Net 1578.63 ml   Filed Weights   05/11/19 2003  Weight: 104.3  kg    Exam: General exam: In no acute distress. Respiratory system: Good air movement and clear to auscultation. Cardiovascular system: S1 & S2 heard, RRR. No JVD. Gastrointestinal system: Abdomen is nondistended, soft and nontender.  Central nervous system: Alert and oriented. No focal neurological deficits. Extremities: No pedal edema. Skin: No rashes, lesions or ulcers Psychiatry: Judgement and insight appear normal. Mood & affect appropriate.    Data Reviewed:    Labs: Basic Metabolic Panel: Recent Labs  Lab 05/12/19 0040 05/12/19 0815   NA 137 138  K 3.4* 3.3*  CL 104 105  CO2 23 24  GLUCOSE 131* 112*  BUN 10 8  CREATININE 0.84 0.85  CALCIUM 8.2* 7.6*   GFR Estimated Creatinine Clearance: 89 mL/min (by C-G formula based on SCr of 0.85 mg/dL). Liver Function Tests: Recent Labs  Lab 05/12/19 0040 05/12/19 0815  AST 16 14*  ALT 13 12  ALKPHOS 55 48  BILITOT 0.8 0.8  PROT 8.2* 7.5  ALBUMIN 4.0 3.6   Recent Labs  Lab 05/12/19 0040  LIPASE 17   No results for input(s): AMMONIA in the last 168 hours. Coagulation profile No results for input(s): INR, PROTIME in the last 168 hours. COVID-19 Labs  Recent Labs    05/14/19 0100  DDIMER 7.07*  CRP 13.4*    Lab Results  Component Value Date   SARSCOV2NAA POSITIVE (A) 05/12/2019    CBC: Recent Labs  Lab 05/12/19 0040 05/12/19 0815  WBC 8.2 7.9  NEUTROABS 7.2  --   HGB 12.0 11.4*  HCT 38.3 36.5  MCV 87.6 88.6  PLT 205 194   Cardiac Enzymes: No results for input(s): CKTOTAL, CKMB, CKMBINDEX, TROPONINI in the last 168 hours. BNP (last 3 results) No results for input(s): PROBNP in the last 8760 hours. CBG: No results for input(s): GLUCAP in the last 168 hours. D-Dimer: Recent Labs    05/14/19 0100  DDIMER 7.07*   Hgb A1c: No results for input(s): HGBA1C in the last 72 hours. Lipid Profile: No results for input(s): CHOL, HDL, LDLCALC, TRIG, CHOLHDL, LDLDIRECT in the last 72 hours. Thyroid function studies: No results for input(s): TSH, T4TOTAL, T3FREE, THYROIDAB in the last 72 hours.  Invalid input(s): FREET3 Anemia work up: No results for input(s): VITAMINB12, FOLATE, FERRITIN, TIBC, IRON, RETICCTPCT in the last 72 hours. Sepsis Labs: Recent Labs  Lab 05/12/19 0040 05/12/19 0815 05/13/19 0830 05/14/19 0100  PROCALCITON  --   --  0.59 0.32  WBC 8.2 7.9  --   --    Microbiology Recent Results (from the past 240 hour(s))  SARS CORONAVIRUS 2 (TAT 6-24 HRS) Nasopharyngeal Nasopharyngeal Swab     Status: Abnormal   Collection  Time: 05/12/19  4:18 AM   Specimen: Nasopharyngeal Swab  Result Value Ref Range Status   SARS Coronavirus 2 POSITIVE (A) NEGATIVE Final    Comment: (NOTE) SARS-CoV-2 target nucleic acids are DETECTED. The SARS-CoV-2 RNA is generally detectable in upper and lower respiratory specimens during the acute phase of infection. Positive results are indicative of active infection with SARS-CoV-2. Clinical  correlation with patient history and other diagnostic information is necessary to determine patient infection status. Positive results do  not rule out bacterial infection or co-infection with other viruses. The expected result is Negative. Fact Sheet for Patients: SugarRoll.be Fact Sheet for Healthcare Providers: https://www.woods-mathews.com/ This test is not yet approved or cleared by the [place] FDA and  has been authorized for detection and/or diagnosis of SARS-CoV-2 by FDA  under an Emergency Use Authorization (EUA). This EUA will remain  in effect (meaning this test can be used) for the duration of the COVID-19 declaration under Section 564(b)(1) of the Act, 21 U.S.C.  section 360bbb-3(b)(1), unless the authorization is terminated or revoked sooner. Performed at [facility], [place] [address] [place], [place] [address]      Medications:    amLODipine  10 mg Oral Daily   enoxaparin (LOVENOX) injection  40 mg Subcutaneous Q24H   feeding supplement  1 Container Oral TID BM   ferrous sulfate  325 mg Oral Q breakfast   hydrochlorothiazide  25 mg Oral Daily   levothyroxine  288 mcg Oral QAC breakfast   lisdexamfetamine  50 mg Oral Daily   nebivolol  10 mg Oral Daily   pantoprazole  40 mg Oral BID   Continuous Infusions:  dextrose 5 % and 0.45 % NaCl with KCl 40 mEq/L 75 mL/hr at 05/13/19 2300      LOS: 2 days   Charlynne Cousins  [facility]  05/14/2019, 7:31 AM

## 2019-05-15 LAB — BASIC METABOLIC PANEL
Anion gap: 13 (ref 5–15)
BUN: 10 mg/dL (ref 6–20)
CO2: 23 mmol/L (ref 22–32)
Calcium: 8.7 mg/dL — ABNORMAL LOW (ref 8.9–10.3)
Chloride: 99 mmol/L (ref 98–111)
Creatinine, Ser: 0.72 mg/dL (ref 0.44–1.00)
GFR calc Af Amer: >60 mL/min (ref >60)
GFR calc non Af Amer: >60 mL/min (ref >60)
Glucose, Bld: 83 mg/dL (ref 70–99)
Potassium: 3.8 mmol/L (ref 3.5–5.1)
Sodium: 135 mmol/L (ref 135–145)

## 2019-05-15 LAB — C-REACTIVE PROTEIN: CRP: 6.5 mg/dL — ABNORMAL HIGH (ref <1.0)

## 2019-05-15 LAB — D-DIMER, QUANTITATIVE: D-Dimer, Quant: 4.56 ug/mL-FEU — ABNORMAL HIGH (ref 0.00–0.50)

## 2019-05-15 LAB — PROCALCITONIN: Procalcitonin: 0.19 ng/mL

## 2019-05-15 NOTE — Progress Notes (Signed)
[facility] PROGRESS NOTE    Progress Note  Karen Cruz  [id number] DOB: 1971/05/17 DOA: 05/11/2019 PCP: Guy Begin, FNP     Brief Narrative:   Karen Cruz is an 48 y.o. 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  She denies chest pain or shortness of breath.  CT of the abdomen Fluid-filled small bowel with short segment small bowel wall thickening or perienteric stranding.  Assessment/Plan:   COVID-19 Enteritis/Diarrhea and abdominal pain: Procalcitonin continues to trend down. He has remained afebrile she has no leukocytosis. She relates her diarrhea has resolved. She continues to have some abdominal discomfort. She denies any chest pain or shortness of breath. She relates she tolerated a full liquid diet today, she would like to try a soft diet. She also like to ambulate in the halls.  Hypokalemia: Likely due to diarrhea, and has resolved. Her potassium is improved.  Hypothyroidism: Continue Synthroid.  Essential hypertension: Continue hydrochlorothiazide, amlodipine and bisoprolol.  Endometrial abnormality: CT scan of the abdomen pelvis on 05/11/2019 showed an enlarged uterus and ill-defined with an increase of endometrial fluid compared to CT scan 16 months prior. Other evaluation by her GYN as an outpatient.  DVT prophylaxis: lovenox Family Communication:none Disposition Plan/Barrier to D/C: Unable to determine. Code Status:     Code Status Orders  (From admission, onward)         Start     Ordered   05/12/19 0702  Full code  Continuous     05/12/19 0701        Code Status History    This patient has a current code status but no historical code status.   Advance Care  Planning Activity        IV Access:    Peripheral IV   Procedures and diagnostic studies:   No results found.   Medical Consultants:    None.  Anti-Infectives:   Ciprofloxacin and Flagyl.  Subjective:    Karen Cruz tolerated her clear liquid diet, no further diarrhea.  Objective:    Vitals:   05/14/19 0741 05/14/19 1525 05/14/19 2000 05/15/19 0500  BP: 139/83 123/76 134/82 134/82  Pulse: 82 77 87 76  Resp: 20  18 16   Temp: 98.6 F (37 C) 99.4 F (37.4 C) 99.6 F (37.6 C) 99.5 F (37.5 C)  TempSrc: Oral Oral Oral Oral  SpO2: 90% 94% 95% 98%  Weight:      Height:       SpO2: 98 %   Intake/Output Summary (Last 24 hours) at 05/15/2019 0726 Last data filed at 05/14/2019 1800 Gross per 24 hour  Intake 698.22 ml  Output -  Net 698.22 ml   Filed Weights   05/11/19 2003  Weight: 104.3 kg    Exam: General exam: In no acute distress. Respiratory system: Good air movement and clear to auscultation. Cardiovascular system: S1 & S2 heard, RRR. No JVD. Gastrointestinal system: Abdomen is soft nontender nondistended. Central nervous system: Alert and oriented. No focal neurological deficits. Extremities: No pedal edema. Skin: No rashes, lesions or ulcers Psychiatry: Judgement and insight appear normal. Mood & affect appropriate.    Data Reviewed:    Labs: Basic Metabolic Panel: Recent Labs  Lab 05/12/19 0040 05/12/19 0815 05/14/19 0100 05/15/19 0440  NA 137 138 136 135  K 3.4* 3.3* 4.4 3.8  CL 104 105 103 99  CO2 23 24 22 23   GLUCOSE [id number]* [id number]* 82 83  BUN 10 8 6 10   CREATININE 0.84 0.85 0.70 0.72  CALCIUM 8.2* 7.6* 8.5* 8.7*   GFR Estimated Creatinine Clearance: 94.5 mL/min (by C-G formula based on SCr of 0.72 mg/dL). Liver Function Tests: Recent Labs  Lab 05/12/19 0040 05/12/19 0815  AST 16 14*  ALT 13 12  ALKPHOS 55 48  BILITOT 0.8 0.8  PROT 8.2* 7.5  ALBUMIN 4.0 3.6   Recent Labs  Lab 05/12/19 0040  LIPASE 17   No  results for input(s): AMMONIA in the last 168 hours. Coagulation profile No results for input(s): INR, PROTIME in the last 168 hours. COVID-19 Labs  Recent Labs    05/14/19 0100 05/15/19 0440  DDIMER 7.07* 4.56*  CRP 13.4* 6.5*    Lab Results  Component Value Date   SARSCOV2NAA POSITIVE (A) 05/12/2019    CBC: Recent Labs  Lab 05/12/19 0040 05/12/19 0815  WBC 8.2 7.9  NEUTROABS 7.2  --   HGB 12.0 11.4*  HCT 38.3 36.5  MCV 87.6 88.6  PLT 205 194   Cardiac Enzymes: No results for input(s): CKTOTAL, CKMB, CKMBINDEX, TROPONINI in the last 168 hours. BNP (last 3 results) No results for input(s): PROBNP in the last 8760 hours. CBG: No results for input(s): GLUCAP in the last 168 hours. D-Dimer: Recent Labs    05/14/19 0100 05/15/19 0440  DDIMER 7.07* 4.56*   Hgb A1c: No results for input(s): HGBA1C in the last 72 hours. Lipid Profile: No results for input(s): CHOL, HDL, LDLCALC, TRIG, CHOLHDL, LDLDIRECT in the last 72 hours. Thyroid function studies: No results for input(s): TSH, T4TOTAL, T3FREE, THYROIDAB in the last 72 hours.  Invalid input(s): FREET3 Anemia work up: No results for input(s): VITAMINB12, FOLATE, FERRITIN, TIBC, IRON, RETICCTPCT in the last 72 hours. Sepsis Labs: Recent Labs  Lab 05/12/19 0040 05/12/19 0815 05/13/19 0830 05/14/19 0100 05/15/19 0440  PROCALCITON  --   --  0.59 0.32 0.19  WBC 8.2 7.9  --   --   --    Microbiology Recent Results (from the past 240 hour(s))  SARS CORONAVIRUS 2 (TAT 6-24 HRS) Nasopharyngeal Nasopharyngeal Swab     Status: Abnormal   Collection Time: 05/12/19  4:18 AM   Specimen: Nasopharyngeal Swab  Result Value Ref Range Status   SARS Coronavirus 2 POSITIVE (A) NEGATIVE Final    Comment: (NOTE) SARS-CoV-2 target nucleic acids are DETECTED. The SARS-CoV-2 RNA is generally detectable in upper and lower respiratory specimens during the acute phase of infection. Positive results are indicative of active  infection with SARS-CoV-2. Clinical  correlation with patient history and other diagnostic information is necessary to determine patient infection status. Positive results do  not rule out bacterial infection or co-infection with other viruses. The expected result is Negative. Fact Sheet for Patients: SugarRoll.be Fact Sheet for Healthcare Providers: https://www.woods-mathews.com/ This test is not yet approved or cleared by the [place] FDA and  has been authorized for detection and/or diagnosis of SARS-CoV-2 by FDA under an Emergency Use Authorization (EUA). This EUA will remain  in effect (meaning this test can be used) for the duration of the COVID-19 declaration under Section 564(b)(1) of the Act, 21 U.S.C.  section 360bbb-3(b)(1), unless the authorization is terminated or revoked sooner. Performed at [facility], [place] [address] [place], [place] [address]  Medications:   . amLODipine  10 mg Oral Daily  . enoxaparin (LOVENOX) injection  40 mg Subcutaneous Q24H  . ferrous sulfate  325 mg Oral Q breakfast  . hydrochlorothiazide  25 mg Oral Daily  . levothyroxine  288 mcg Oral QAC breakfast  . lisdexamfetamine  50 mg Oral Daily  . nebivolol  10 mg Oral Daily  . pantoprazole  40 mg Oral BID   Continuous Infusions: . dextrose 5 % and 0.45 % NaCl with KCl 40 mEq/L 10 mL/hr at 05/15/19 0527      LOS: 3 days   Charlynne Cousins  [facility]  05/15/2019, 7:26 AM

## 2019-05-15 NOTE — Progress Notes (Signed)
Patient deferred family update. 

## 2019-05-15 NOTE — Progress Notes (Signed)
Pt deferred nurse update as she is in regular contact with her family. She agreed to pass along any questions as they may arise

## 2019-05-16 LAB — BASIC METABOLIC PANEL
Anion gap: 10 (ref 5–15)
BUN: 19 mg/dL (ref 6–20)
CO2: 25 mmol/L (ref 22–32)
Calcium: 8.6 mg/dL — ABNORMAL LOW (ref 8.9–10.3)
Chloride: 102 mmol/L (ref 98–111)
Creatinine, Ser: 0.74 mg/dL (ref 0.44–1.00)
GFR calc Af Amer: >60 mL/min (ref >60)
GFR calc non Af Amer: >60 mL/min (ref >60)
Glucose, Bld: 97 mg/dL (ref 70–99)
Potassium: 3.5 mmol/L (ref 3.5–5.1)
Sodium: 137 mmol/L (ref 135–145)

## 2019-05-16 LAB — C-REACTIVE PROTEIN: CRP: 3.8 mg/dL — ABNORMAL HIGH (ref <1.0)

## 2019-05-16 LAB — D-DIMER, QUANTITATIVE: D-Dimer, Quant: 3.44 ug/mL-FEU — ABNORMAL HIGH (ref 0.00–0.50)

## 2019-05-16 NOTE — Discharge Summary (Signed)
Physician Discharge Summary  Karen Cruz [id number] DOB: December 09, 1970 DOA: 05/11/2019  PCP: Guy Begin, FNP  Admit date: 05/11/2019 Discharge date: 05/16/2019  Admitted From: Home Disposition:  Home  Recommendations for Outpatient Follow-up:  1. Follow up with PCP in 1-2 weeks 2. Please obtain BMP/CBC in one week 3. She will need a vaginal ultrasound as an outpatient to evaluate for abnormality seen on CT scan on 05/11/2019 of her uterus.  She will follow-up with OB/GYN as an outpatient.   Home Health:no Equipment/Devices:none  Discharge Condition:Stable CODE STATUS:Full Diet recommendation: Heart Healthy   Brief/Interim Summary: 48 y.o. 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  She denies chest pain or shortness of breath.  CT of the abdomen Fluid-filled small bowel with short segment small bowel wall  Discharge Diagnoses:  Principal Problem:   Enteritis Active Problems:   Diarrhea   Left lower quadrant abdominal pain   Hypothyroidism   Hypertension   Depression COVID-19 enteritis/diarrhea and abdominal pain: On admission she was started on IV ciprofloxacin and Flagyl. She remained afebrile no leukocytosis. As her procalcitonin was low her antibiotic coverage was discontinued. Her diarrhea resolved abdominal pain improved her diet was advanced slowly which she tolerated.  He was ambulating the hall and tolerating her diet.  Hypokalemia: Likely due to diarrhea, replete orally now resolved.  Hypothyroidism: Continue Synthroid.  Essential hypertension: No changes made to her medication.  Endometrial abnormalities: CT scan of the abdomen and pelvis 05/11/2019 showed an enlarged uterus unable to  find with increased endometrial fluids compared to prior CT scan. She would need an OB/GYN evaluation as an outpatient.   Discharge Instructions  Discharge Instructions    Diet - low sodium heart healthy   Complete by: As directed    Increase activity slowly   Complete by: As directed      Allergies as of 05/16/2019   No Known Allergies     Medication List    STOP taking these medications   ketorolac 10 MG tablet Commonly known as: TORADOL   naproxen 500 MG tablet Commonly known as: NAPROSYN     TAKE these medications   ALPRAZolam 0.5 MG tablet Commonly known as: XANAX Take 0.25 mg by mouth at bedtime as needed for sleep.   amLODipine 10 MG tablet Commonly known as: NORVASC 10 mg daily.   cholecalciferol 25 MCG (1000 UT) tablet Commonly known as: VITAMIN D3 Take 1,000 Units by mouth daily.   dicyclomine 20 MG tablet Commonly known as: BENTYL   ferrous sulfate 325 (65 FE) MG EC tablet Take 325 mg by mouth daily with breakfast.   hydrochlorothiazide 25 MG tablet Commonly known as: HYDRODIURIL Take 25 mg by mouth daily.   HYDROcodone-acetaminophen 5-325 MG tablet Commonly known as: NORCO/VICODIN Take 1-2 tablets by mouth every 6 (six) hours as needed for pain.   ibuprofen 800 MG tablet Commonly known as: ADVIL Take 800 mg by mouth 3 (three) times daily.   levothyroxine 200 MCG tablet Commonly known as: SYNTHROID Take 200 mcg by mouth daily.   levothyroxine 88 MCG tablet Commonly known as: SYNTHROID Take 88 mcg by mouth daily.   lisdexamfetamine 50 MG capsule Commonly known as: VYVANSE Take 50 mg by mouth every morning.   megestrol 40 MG  tablet Commonly known as: MEGACE 1 tablet daily   metroNIDAZOLE 0.75 % vaginal gel Commonly known as: METROGEL VAGINAL Nightly x 5 nights   nebivolol 10 MG tablet Commonly known as: BYSTOLIC Take 10 mg by mouth daily.   ondansetron 4 MG tablet Commonly known as: ZOFRAN Take 1 tablet (4 mg total) by mouth  every 8 (eight) hours as needed for nausea or vomiting.   promethazine 25 MG tablet Commonly known as: PHENERGAN Take 1 tablet (25 mg total) by mouth every 6 (six) hours as needed for nausea.   Vitamin D (Ergocalciferol) 1.25 MG (50000 UT) Caps capsule Commonly known as: DRISDOL Take 50,000 Units by mouth every 7 (seven) days. Takes on Monday.       No Known Allergies  Consultations:  None   Procedures/Studies: Ct Abdomen Pelvis W Contrast  Result Date: 05/12/2019 CLINICAL DATA:  Generalized abdominal pain. Nausea. Diverticulitis suspected. EXAM: CT ABDOMEN AND PELVIS WITH CONTRAST TECHNIQUE: Multidetector CT imaging of the abdomen and pelvis was performed using the standard protocol following bolus administration of intravenous contrast. CONTRAST:  141mL OMNIPAQUE IOHEXOL 300 MG/ML  SOLN COMPARISON:  CT 12/31/2017 FINDINGS: Lower chest: Linear and bandlike atelectasis or scarring in both lung bases. No pleural fluid. Hepatobiliary: No focal liver abnormality is seen. No gallstones, gallbladder wall thickening, or biliary dilatation. Pancreas: No ductal dilatation or inflammation. Spleen: Normal in size without focal abnormality. Adrenals/Urinary Tract: Normal adrenal glands. No hydronephrosis or perinephric edema. Homogeneous renal enhancement with symmetric excretion on delayed phase imaging. Nonobstructing stones in the lower left kidney, largest measuring 10 mm. Punctate nonobstructing stone in the mid right kidney. Tiny cortical hypodensity in the left mid kidney is too small to characterize. Urinary bladder is physiologically distended without wall thickening. Stomach/Bowel: Small hiatal hernia. Stomach otherwise decompressed. Fluid-filled nondilated small bowel in the lower abdomen. Suggestion of mild small bowel wall thickening and perienteric inflammation in the left lower quadrant, series 4, image 62. No obstruction. Normal appendix. Small volume of colonic stool. No colonic wall  thickening or inflammation. No significant diverticular disease. Vascular/Lymphatic: Abdominal aorta is normal in caliber. Portal vein is patent. Prominent 9 mm left external iliac node series 4, image 69. No upper abdominal adenopathy. Reproductive: Enlarged uterus with ill-defined fluid/low-density in the endometrial canal. Degree of fluid is increased from prior CT. Slight soft tissue prominence in the cervix. No definite adnexal mass. Other: Small amount of free fluid in the pelvis, slightly increased from prior. No upper abdominal ascites. Supraumbilical ventral abdominal wall hernia contains fat, previous transverse colon involvement has resolved. Additional complex umbilical and supraumbilical fat containing abdominal wall hernias. No bowel involvement or inflammation. Musculoskeletal: Degenerative change in the lumbar spine with primarily facet hypertrophy. Stable bone island in the right proximal femur. There are no acute or suspicious osseous abnormalities. IMPRESSION: 1. Fluid-filled small bowel with short segment small bowel wall thickening or perienteric stranding, suggesting enteritis. This may be infectious or inflammatory. No bowel obstruction. 2. Enlarged uterus with ill-defined fluid/low-density in the endometrial canal. The degree of endometrial fluid has increased from CT 16 months prior. Well endometrial findings may be related to prior endometrial ablation, recommend nonemergent pelvic ultrasound for further characterization to assess for endometrial thickening. 3. Nonobstructing bilateral nephrolithiasis. 4. Multiple ventral abdominal wall hernias containing fat, no bowel involvement or inflammation. Electronically Signed   By: Keith Rake M.D.   On: 05/12/2019 01:46   Dg Chest Port 1 View  Result Date: 05/12/2019 CLINICAL DATA:  COVID positive EXAM:  PORTABLE CHEST 1 VIEW COMPARISON:  07/21/2016 FINDINGS: Streaky atelectasis at the bases. No consolidation. Normal heart size. No  pneumothorax. IMPRESSION: Linear opacity at both lung bases suggestive of atelectasis. Electronically Signed   By: Donavan Foil M.D.   On: 05/12/2019 22:17     Subjective: No complaints.  Discharge Exam: Vitals:   05/16/19 0354 05/16/19 0711  BP: 119/77 128/83  Pulse: 77 83  Resp: 17 18  Temp: 98.6 F (37 C) 99.4 F (37.4 C)  SpO2:  93%   Vitals:   05/15/19 1537 05/15/19 1700 05/16/19 0354 05/16/19 0711  BP: 110/69 105/72 119/77 128/83  Pulse: 86 88 77 83  Resp: 14 17 17 18   Temp: 99.3 F (37.4 C) 98.7 F (37.1 C) 98.6 F (37 C) 99.4 F (37.4 C)  TempSrc: Oral Oral Oral Oral  SpO2: 92% 94%  93%  Weight:      Height:        General: Pt is alert, awake, not in acute distress Cardiovascular: RRR, S1/S2 +, no rubs, no gallops Respiratory: CTA bilaterally, no wheezing, no rhonchi Abdominal: Soft, NT, ND, bowel sounds + Extremities: no edema, no cyanosis    The results of significant diagnostics from this hospitalization (including imaging, microbiology, ancillary and laboratory) are listed below for reference.     Microbiology: Recent Results (from the past 240 hour(s))  SARS CORONAVIRUS 2 (TAT 6-24 HRS) Nasopharyngeal Nasopharyngeal Swab     Status: Abnormal   Collection Time: 05/12/19  4:18 AM   Specimen: Nasopharyngeal Swab  Result Value Ref Range Status   SARS Coronavirus 2 POSITIVE (A) NEGATIVE Final    Comment: (NOTE) SARS-CoV-2 target nucleic acids are DETECTED. The SARS-CoV-2 RNA is generally detectable in upper and lower respiratory specimens during the acute phase of infection. Positive results are indicative of active infection with SARS-CoV-2. Clinical  correlation with patient history and other diagnostic information is necessary to determine patient infection status. Positive results do  not rule out bacterial infection or co-infection with other viruses. The expected result is Negative. Fact Sheet for  Patients: SugarRoll.be Fact Sheet for Healthcare Providers: https://www.woods-mathews.com/ This test is not yet approved or cleared by the [place] FDA and  has been authorized for detection and/or diagnosis of SARS-CoV-2 by FDA under an Emergency Use Authorization (EUA). This EUA will remain  in effect (meaning this test can be used) for the duration of the COVID-19 declaration under Section 564(b)(1) of the Act, 21 U.S.C.  section 360bbb-3(b)(1), unless the authorization is terminated or revoked sooner. Performed at [facility], [place] [address], [place], [place] [address]      Labs: BNP (last 3 results) No results for input(s): BNP in the last 8760 hours. Basic Metabolic Panel: Recent Labs  Lab 05/12/19 0040 05/12/19 0815 05/14/19 0100 05/15/19 0440 05/16/19 0146  NA 137 138 136 135 137  K 3.4* 3.3* 4.4 3.8 3.5  CL 104 105 103 99 102  CO2 23 24 22 23 25   GLUCOSE 131* 112* 82 83 97  BUN 10 8 6 10 19   CREATININE 0.84 0.85 0.70 0.72 0.74  CALCIUM 8.2* 7.6* 8.5* 8.7* 8.6*   Liver Function Tests: Recent Labs  Lab 05/12/19 0040 05/12/19 0815  AST 16 14*  ALT 13 12  ALKPHOS 55 48  BILITOT 0.8 0.8  PROT 8.2* 7.5  ALBUMIN 4.0 3.6   Recent Labs  Lab 05/12/19 0040  LIPASE 17   No results for input(s): AMMONIA in the last 168  hours. CBC: Recent Labs  Lab 05/12/19 0040 05/12/19 0815  WBC 8.2 7.9  NEUTROABS 7.2  --   HGB 12.0 11.4*  HCT 38.3 36.5  MCV 87.6 88.6  PLT 205 194   Cardiac Enzymes: No results for input(s): CKTOTAL, CKMB, CKMBINDEX, TROPONINI in the last 168 hours. BNP: Invalid input(s): POCBNP CBG: No results for input(s): GLUCAP in the last 168 hours. D-Dimer Recent Labs    05/15/19 0440 05/16/19 0146  DDIMER 4.56* 3.44*   Hgb A1c No results for input(s): HGBA1C in the last 72 hours. Lipid Profile No results for input(s): CHOL, HDL, LDLCALC, TRIG, CHOLHDL, LDLDIRECT in the last 72  hours. Thyroid function studies No results for input(s): TSH, T4TOTAL, T3FREE, THYROIDAB in the last 72 hours.  Invalid input(s): FREET3 Anemia work up No results for input(s): VITAMINB12, FOLATE, FERRITIN, TIBC, IRON, RETICCTPCT in the last 72 hours. Urinalysis    Component Value Date/Time   COLORURINE YELLOW 05/12/2019 0341   APPEARANCEUR CLEAR 05/12/2019 0341   LABSPEC >1.046 (H) 05/12/2019 0341   PHURINE 5.0 05/12/2019 0341   GLUCOSEU NEGATIVE 05/12/2019 0341   HGBUR NEGATIVE 05/12/2019 0341   BILIRUBINUR NEGATIVE 05/12/2019 0341   KETONESUR 20 (A) 05/12/2019 0341   PROTEINUR 30 (A) 05/12/2019 0341   UROBILINOGEN 1.0 10/09/2012 0820   NITRITE NEGATIVE 05/12/2019 0341   LEUKOCYTESUR NEGATIVE 05/12/2019 0341   Sepsis Labs Invalid input(s): PROCALCITONIN,  WBC,  LACTICIDVEN Microbiology Recent Results (from the past 240 hour(s))  SARS CORONAVIRUS 2 (TAT 6-24 HRS) Nasopharyngeal Nasopharyngeal Swab     Status: Abnormal   Collection Time: 05/12/19  4:18 AM   Specimen: Nasopharyngeal Swab  Result Value Ref Range Status   SARS Coronavirus 2 POSITIVE (A) NEGATIVE Final    Comment: (NOTE) SARS-CoV-2 target nucleic acids are DETECTED. The SARS-CoV-2 RNA is generally detectable in upper and lower respiratory specimens during the acute phase of infection. Positive results are indicative of active infection with SARS-CoV-2. Clinical  correlation with patient history and other diagnostic information is necessary to determine patient infection status. Positive results do  not rule out bacterial infection or co-infection with other viruses. The expected result is Negative. Fact Sheet for Patients: SugarRoll.be Fact Sheet for Healthcare Providers: https://www.woods-mathews.com/ This test is not yet approved or cleared by the [place] FDA and  has been authorized for detection and/or diagnosis of SARS-CoV-2 by FDA under an Emergency Use  Authorization (EUA). This EUA will remain  in effect (meaning this test can be used) for the duration of the COVID-19 declaration under Section 564(b)(1) of the Act, 21 U.S.C.  section 360bbb-3(b)(1), unless the authorization is terminated or revoked sooner. Performed at [facility], [place] [address] [place], [place] [address]      Time coordinating discharge: Over 40 minutes  SIGNED:   Charlynne Cousins, MD  [facility] 05/16/2019, 7:15 AM Pager   If 7PM-7AM, please contact night-coverage [email or web address] Password [email or web address]

## 2019-05-16 NOTE — Progress Notes (Signed)
Pt deferred nurse update as she is in regular contact with her family. She agreed to pass along any questions as they may arise. IV d/c'ed. AVS reviewed with pt and all questions answered. Pt will call out with car description and ETA for an escort out.

## 2019-10-26 ENCOUNTER — Other Ambulatory Visit (HOSPITAL_COMMUNITY)
Admission: RE | Admit: 2019-10-26 | Discharge: 2019-10-26 | Disposition: A | Discharge status: Home / Self Care | Payer: 59 | Source: Ambulatory Visit | Attending: Obstetrics & Gynecology | Admitting: Obstetrics & Gynecology

## 2019-10-26 ENCOUNTER — Encounter: Payer: Self-pay | Admitting: Obstetrics & Gynecology

## 2019-10-26 ENCOUNTER — Ambulatory Visit (INDEPENDENT_AMBULATORY_CARE_PROVIDER_SITE_OTHER): Payer: 59 | Admitting: Obstetrics & Gynecology

## 2019-10-26 ENCOUNTER — Other Ambulatory Visit: Payer: Self-pay

## 2019-10-26 VITALS — BP 184/96 | HR 84 | Ht 61.0 in | Wt 240.0 lb

## 2019-10-26 DIAGNOSIS — N852 Hypertrophy of uterus: Secondary | ICD-10-CM

## 2019-10-26 DIAGNOSIS — Z01419 Encounter for gynecological examination (general) (routine) without abnormal findings: Secondary | ICD-10-CM | POA: Insufficient documentation

## 2019-10-26 NOTE — Progress Notes (Signed)
Subjective:     Karen Cruz is a 49 y.o. female here for a routine exam.  No LMP recorded. Patient has had an ablation. [id number] Birth Control Method:  BTL Menstrual Calendar(currently): ablation no bleeding spots 1 dy  Current complaints: pelvic cramping/pressure.   Current acute medical issues:  COVID 19 11/20   Recent Gynecologic History No LMP recorded. Patient has had an ablation. Last Pap: 5/18,  normal Last mammogram: 03/2017,  normal  Past Medical History:  Diagnosis Date  . Anemia   . Depression   . Hypertension   . Thyroid disease     Past Surgical History:  Procedure Laterality Date  . TUBAL LIGATION    . uterine ablasion      OB History    Gravida  4   Para  3   Term  3   Preterm      AB  1   Living  3     SAB      TAB      Ectopic      Multiple      Live Births              Social History   Socioeconomic History  . Marital status: Married    Spouse name: Not on file  . Number of children: Not on file  . Years of education: Not on file  . Highest education level: Not on file  Occupational History  . Not on file  Tobacco Use  . Smoking status: Never Smoker  . Smokeless tobacco: Never Used  Substance and Sexual Activity  . Alcohol use: Yes    Comment: socially  . Drug use: No  . Sexual activity: Yes    Birth control/protection: None  Other Topics Concern  . Not on file  Social History Narrative  . Not on file   Social Determinants of Health   Financial Resource Strain:   . Difficulty of Paying Living Expenses:   Food Insecurity:   . Worried About Charity fundraiser in the Last Year:   . Arboriculturist in the Last Year:   Transportation Needs:   . Film/video editor (Medical):   Marland Kitchen Lack of Transportation (Non-Medical):   Physical Activity:   . Days of Exercise per Week:   . Minutes of Exercise per Session:   Stress:   . Feeling of Stress :   Social Connections:   . Frequency of Communication with Friends  and Family:   . Frequency of Social Gatherings with Friends and Family:   . Attends Religious Services:   . Active Member of Clubs or Organizations:   . Attends Archivist Meetings:   Marland Kitchen Marital Status:     History reviewed. No pertinent family history.   Current Outpatient Medications:  .  amLODipine (NORVASC) 10 MG tablet, 10 mg daily. , Disp: , Rfl: 3 .  cholecalciferol (VITAMIN D3) 25 MCG (1000 UT) tablet, Take 1,000 Units by mouth daily., Disp: , Rfl:  .  hydrochlorothiazide (HYDRODIURIL) 25 MG tablet, Take 25 mg by mouth daily., Disp: , Rfl:  .  ibuprofen (ADVIL) 800 MG tablet, Take 800 mg by mouth 3 (three) times daily., Disp: , Rfl:  .  levothyroxine (SYNTHROID) 200 MCG tablet, Take 200 mcg by mouth daily., Disp: , Rfl:  .  levothyroxine (SYNTHROID) 88 MCG tablet, Take 88 mcg by mouth daily., Disp: , Rfl:  .  nebivolol (BYSTOLIC) 10 MG tablet, Take 10 mg  by mouth daily., Disp: , Rfl:  .  Vitamin D, Ergocalciferol, (DRISDOL) 50000 UNITS CAPS, Take 50,000 Units by mouth every 7 (seven) days. Takes on Monday., Disp: , Rfl:  .  ALPRAZolam (XANAX) 0.5 MG tablet, Take 0.25 mg by mouth at bedtime as needed for sleep., Disp: , Rfl:  .  dicyclomine (BENTYL) 20 MG tablet, , Disp: , Rfl: 2 .  ferrous sulfate 325 (65 FE) MG EC tablet, Take 325 mg by mouth daily with breakfast., Disp: , Rfl:  .  HYDROcodone-acetaminophen (NORCO/VICODIN) 5-325 MG per tablet, Take 1-2 tablets by mouth every 6 (six) hours as needed for pain. (Patient not taking: Reported on 05/12/2019), Disp: 10 tablet, Rfl: 0 .  lisdexamfetamine (VYVANSE) 50 MG capsule, Take 50 mg by mouth every morning., Disp: , Rfl:  .  megestrol (MEGACE) 40 MG tablet, 1 tablet daily (Patient not taking: Reported on 05/12/2019), Disp: 30 tablet, Rfl: 3 .  metroNIDAZOLE (METROGEL VAGINAL) 0.75 % vaginal gel, Nightly x 5 nights (Patient not taking: Reported on 05/12/2019), Disp: 70 g, Rfl: 0 .  ondansetron (ZOFRAN) 4 MG tablet, Take 1  tablet (4 mg total) by mouth every 8 (eight) hours as needed for nausea or vomiting. (Patient not taking: Reported on 01/02/2018), Disp: 10 tablet, Rfl: 0 .  promethazine (PHENERGAN) 25 MG tablet, Take 1 tablet (25 mg total) by mouth every 6 (six) hours as needed for nausea. (Patient not taking: Reported on 01/02/2018), Disp: 12 tablet, Rfl: 0  Review of Systems  Review of Systems  Constitutional: Negative for fever, chills, weight loss, malaise/fatigue and diaphoresis.  HENT: Negative for hearing loss, ear pain, nosebleeds, congestion, sore throat, neck pain, tinnitus and ear discharge.   Eyes: Negative for blurred vision, double vision, photophobia, pain, discharge and redness.  Respiratory: Negative for cough, hemoptysis, sputum production, shortness of breath, wheezing and stridor.   Cardiovascular: Negative for chest pain, palpitations, orthopnea, claudication, leg swelling and PND.  Gastrointestinal: negative for abdominal pain. Negative for heartburn, nausea, vomiting, diarrhea, constipation, blood in stool and melena.  Genitourinary: Negative for dysuria, urgency, frequency, hematuria and flank pain.  Musculoskeletal: Negative for myalgias, back pain, joint pain and falls.  Skin: Negative for itching and rash.  Neurological: Negative for dizziness, tingling, tremors, sensory change, speech change, focal weakness, seizures, loss of consciousness, weakness and headaches.  Endo/Heme/Allergies: Negative for environmental allergies and polydipsia. Does not bruise/bleed easily.  Psychiatric/Behavioral: Negative for depression, suicidal ideas, hallucinations, memory loss and substance abuse. The patient is not nervous/anxious and does not have insomnia.        Objective:  Blood pressure (!) 184/96, pulse 84, height 5\' 1"  (1.549 m), weight 240 lb (108.9 kg).   Physical Exam  Vitals reviewed. Constitutional: She is oriented to person, place, and time. She appears well-developed and  well-nourished.  HENT:  Head: Normocephalic and atraumatic.        Right Ear: External ear normal.  Left Ear: External ear normal.  Nose: Nose normal.  Mouth/Throat: Oropharynx is clear and moist.  Eyes: Conjunctivae and EOM are normal. Pupils are equal, round, and reactive to light. Right eye exhibits no discharge. Left eye exhibits no discharge. No scleral icterus.  Neck: Normal range of motion. Neck supple. No tracheal deviation present. No thyromegaly present.  Cardiovascular: Normal rate, regular rhythm, normal heart sounds and intact distal pulses.  Exam reveals no gallop and no friction rub.   No murmur heard. Respiratory: Effort normal and breath sounds normal. No respiratory distress. She has  no wheezes. She has no rales. She exhibits no tenderness.  GI: Soft. Bowel sounds are normal. She exhibits no distension and no mass. There is no tenderness. There is no rebound and no guarding.  Genitourinary:  Breasts no masses skin changes or nipple changes bilaterally      Vulva is normal without lesions Vagina is pink moist without discharge Cervix normal in appearance and pap is done Uterus is enlarged and boggy feeling, no relaxation today appreciated, fibroids/hematometra/aenomyosis Adnexa is negative with normal sized ovaries   Musculoskeletal: Normal range of motion. She exhibits no edema and no tenderness.  Neurological: She is alert and oriented to person, place, and time. She has normal reflexes. She displays normal reflexes. No cranial nerve deficit. She exhibits normal muscle tone. Coordination normal.  Skin: Skin is warm and dry. No rash noted. No erythema. No pallor.  Psychiatric: She has a normal mood and affect. Her behavior is normal. Judgment and thought content normal.       Medications Ordered at today's visit: No orders of the defined types were placed in this encounter.   Other orders placed at today's visit: Orders Placed This Encounter  Procedures  . US  PELVIS TRANSVAGINAL NON-OB (TV ONLY)  . US PELVIS (TRANSABDOMINAL ONLY)      Assessment:    Normal Gyn exam.    Plan:    Contraception: tubal ligation. Mammogram ordered. Follow up in: 3 weeks.  sonogram    No follow-ups on file.

## 2019-10-28 LAB — CYTOLOGY - PAP
Comment: NEGATIVE
Diagnosis: NEGATIVE
High risk HPV: NEGATIVE

## 2019-11-03 ENCOUNTER — Encounter: Payer: Self-pay | Admitting: Obstetrics & Gynecology

## 2019-11-17 ENCOUNTER — Ambulatory Visit (INDEPENDENT_AMBULATORY_CARE_PROVIDER_SITE_OTHER): Payer: 59

## 2019-11-17 ENCOUNTER — Ambulatory Visit (INDEPENDENT_AMBULATORY_CARE_PROVIDER_SITE_OTHER): Payer: 59 | Admitting: Obstetrics & Gynecology

## 2019-11-17 ENCOUNTER — Encounter: Payer: Self-pay | Admitting: Obstetrics & Gynecology

## 2019-11-17 VITALS — BP 158/98 | HR 79 | Ht 61.0 in | Wt 237.0 lb

## 2019-11-17 DIAGNOSIS — N852 Hypertrophy of uterus: Secondary | ICD-10-CM | POA: Diagnosis not present

## 2019-11-17 DIAGNOSIS — D219 Benign neoplasm of connective and other soft tissue, unspecified: Secondary | ICD-10-CM

## 2019-11-17 MED ORDER — KETOROLAC TROMETHAMINE 10 MG PO TABS: 10.0000 mg | ORAL_TABLET | Freq: Three times a day (TID) | ORAL | 1 refills | Status: AC | PRN

## 2019-11-17 NOTE — Progress Notes (Signed)
PELVIC US TA/TV: enlarged, heterogeneous, anteverted uterus with linear striations,anterior intramural fibroid 1.4 x 1.5 x 1.4 cm,complex fluid filled endometrium,EEC 6.1 mm,normal right ovary,complex left ovarian cyst 2.4 x 2.6 x 2 cm,limited view of ovaries,ovaries are best imaged transabdominally,pelvic pain during ultrasound  Chaperone Peggy

## 2019-11-17 NOTE — Progress Notes (Signed)
Follow up appointment for results  Chief Complaint  Patient presents with  . Results    [place] today    Blood pressure (!) 158/98, pulse 79, height 5\' 1"  (1.549 m), weight 237 lb (107.5 kg).  US PELVIS TRANSVAGINAL NON-OB (TV ONLY)  Result Date: 11/17/2019 GYNECOLOGIC SONOGRAM Karen Cruz is a 49 y.o. [id number] No LMP recorded. Patient has had an ablation. for a pelvic sonogram for pelvic pain. Uterus                      12.1 x 8.2 x 8.9 cm, Total uterine volume 460 cc, enlarged, heterogeneous, anteverted uterus with linear striations,anterior intramural fibroid 1.4 x 1.5 x 1.4 cm Endometrium          6.1 mm, symmetrical, complex fluid filled endometrium Right ovary             2.7 x 2.3 x 2.4 cm, wnl,limited view Left ovary                3.2 x 1.8 x 3 cm, complex left ovarian cyst 2.4 x 2.6 x 2 cm,limited view No free fluid Technician Comments: PELVIC US TA/TV: enlarged, heterogeneous, anteverted uterus with linear striations,anterior intramural fibroid 1.4 x 1.5 x 1.4 cm,complex fluid filled endometrium,EEC 6.1 mm,normal right ovary,complex left ovarian cyst 2.4 x 2.6 x 2 cm,limited view of ovaries,ovaries are best imaged transabdominally,pelvic pain during ultrasound Chaperone [employer] 11/17/2019 9:15 AM Clinical Impression and recommendations: I have reviewed the sonogram results above, combined with the patient's current clinical course, below are my impressions and any appropriate recommendations for management based on the sonographic findings. Uterus with multiple fibroids, stable with previous scan Endometrium consistent with previous ablation appropriate Left corpus luteum cyst, physiologic Right ovary is normal Florian Buff 11/17/2019 10:01 AM  US PELVIS (TRANSABDOMINAL ONLY)  Result Date: 11/17/2019 GYNECOLOGIC SONOGRAM Karen Cruz is a 49 y.o. [id number] No LMP recorded. Patient has had an ablation. for a pelvic sonogram for pelvic pain. Uterus                      12.1 x 8.2 x  8.9 cm, Total uterine volume 460 cc, enlarged, heterogeneous, anteverted uterus with linear striations,anterior intramural fibroid 1.4 x 1.5 x 1.4 cm Endometrium          6.1 mm, symmetrical, complex fluid filled endometrium Right ovary             2.7 x 2.3 x 2.4 cm, wnl,limited view Left ovary                3.2 x 1.8 x 3 cm, complex left ovarian cyst 2.4 x 2.6 x 2 cm,limited view No free fluid Technician Comments: PELVIC US TA/TV: enlarged, heterogeneous, anteverted uterus with linear striations,anterior intramural fibroid 1.4 x 1.5 x 1.4 cm,complex fluid filled endometrium,EEC 6.1 mm,normal right ovary,complex left ovarian cyst 2.4 x 2.6 x 2 cm,limited view of ovaries,ovaries are best imaged transabdominally,pelvic pain during ultrasound Chaperone [employer] 11/17/2019 9:15 AM Clinical Impression and recommendations: I have reviewed the sonogram results above, combined with the patient's current clinical course, below are my impressions and any appropriate recommendations for management based on the sonographic findings. Uterus with multiple fibroids, stable with previous scan Endometrium consistent with previous ablation appropriate Left corpus luteum cyst, physiologic Right ovary is normal Florian Buff 11/17/2019 10:01 AM     MEDS ordered this encounter: Meds ordered this encounter  Medications  . ketorolac (TORADOL) 10 MG tablet    Sig: Take 1 tablet (10 mg total) by mouth every 8 (eight) hours as needed.    Dispense:  15 tablet    Refill:  1    Orders for this encounter: No orders of the defined types were placed in this encounter.   Impression:   ICD-10-CM   1. Fibroids, as the cause of the pelvic pain  D21.9   2. Enlarged uterus  N85.2      Plan: Trying to navigate this problem through menopause without a hysterectomy Responds to motrin so will try a course of toradol and see how that helps  Follow Up: Return if symptoms worsen or fail to improve.       Face to  face time:  10 minutes  Greater than 50% of the visit time was spent in counseling and coordination of care with the patient.  The summary and outline of the counseling and care coordination is summarized in the note above.   All questions were answered.  Past Medical History:  Diagnosis Date  . Anemia   . Depression   . Hypertension   . Thyroid disease     Past Surgical History:  Procedure Laterality Date  . TUBAL LIGATION    . uterine ablasion      OB History    Gravida  4   Para  3   Term  3   Preterm      AB  1   Living  3     SAB      TAB      Ectopic      Multiple      Live Births              No Known Allergies  Social History   Socioeconomic History  . Marital status: Married    Spouse name: Not on file  . Number of children: Not on file  . Years of education: Not on file  . Highest education level: Not on file  Occupational History  . Not on file  Tobacco Use  . Smoking status: Never Smoker  . Smokeless tobacco: Never Used  Substance and Sexual Activity  . Alcohol use: Yes    Comment: socially  . Drug use: No  . Sexual activity: Yes    Birth control/protection: None  Other Topics Concern  . Not on file  Social History Narrative  . Not on file   Social Determinants of Health   Financial Resource Strain:   . Difficulty of Paying Living Expenses:   Food Insecurity:   . Worried About Charity fundraiser in the Last Year:   . Arboriculturist in the Last Year:   Transportation Needs:   . Film/video editor (Medical):   Marland Kitchen Lack of Transportation (Non-Medical):   Physical Activity:   . Days of Exercise per Week:   . Minutes of Exercise per Session:   Stress:   . Feeling of Stress :   Social Connections:   . Frequency of Communication with Friends and Family:   . Frequency of Social Gatherings with Friends and Family:   . Attends Religious Services:   . Active Member of Clubs or Organizations:   . Attends Theatre manager Meetings:   Marland Kitchen Marital Status:     History reviewed. No pertinent family history.

## 2020-10-31 LAB — HEMOGLOBIN A1C: Hemoglobin A1C: 6.4

## 2020-10-31 LAB — TSH: TSH: 28.7 — AB (ref 0.41–5.90)

## 2020-10-31 LAB — VITAMIN D 25 HYDROXY (VIT D DEFICIENCY, FRACTURES): Vit D, 25-Hydroxy: 42.3

## 2020-11-23 ENCOUNTER — Ambulatory Visit: Payer: 59 | Admitting: "Endocrinology

## 2020-12-07 ENCOUNTER — Other Ambulatory Visit: Payer: Self-pay

## 2020-12-07 ENCOUNTER — Encounter: Payer: Self-pay | Admitting: "Endocrinology

## 2020-12-07 ENCOUNTER — Ambulatory Visit: Payer: 59 | Admitting: "Endocrinology

## 2020-12-07 VITALS — BP 152/96 | HR 67 | Ht 61.0 in | Wt 238.2 lb

## 2020-12-07 DIAGNOSIS — E559 Vitamin D deficiency, unspecified: Secondary | ICD-10-CM | POA: Diagnosis not present

## 2020-12-07 DIAGNOSIS — R7303 Prediabetes: Secondary | ICD-10-CM | POA: Diagnosis not present

## 2020-12-07 DIAGNOSIS — E890 Postprocedural hypothyroidism: Secondary | ICD-10-CM | POA: Diagnosis not present

## 2020-12-07 MED ORDER — VITAMIN D3 125 MCG (5000 UT) PO CAPS: 5000.0000 [IU] | ORAL_CAPSULE | Freq: Every day | ORAL | 0 refills | Status: DC

## 2020-12-07 NOTE — Patient Instructions (Signed)

## 2020-12-07 NOTE — Progress Notes (Signed)
Endocrinology Consult Note                                            12/07/2020, 6:32 PM   Subjective:    Patient ID: Karen Cruz, female    DOB: [birth year], PCP Pacifico, Micah, FNP   Past Medical History:  Diagnosis Date   Anemia    Depression    Hypertension    Hypothyroidism    Thyroid disease    Past Surgical History:  Procedure Laterality Date   TUBAL LIGATION     uterine ablasion     Social History   Socioeconomic History   Marital status: Married    Spouse name: Not on file   Number of children: Not on file   Years of education: Not on file   Highest education level: Not on file  Occupational History   Not on file  Tobacco Use   Smoking status: Never   Smokeless tobacco: Never  Substance and Sexual Activity   Alcohol use: Yes    Comment: socially   Drug use: No   Sexual activity: Yes    Birth control/protection: None  Other Topics Concern   Not on file  Social History Narrative   Not on file   Social Determinants of Health   Financial Resource Strain: Not on file  Food Insecurity: Not on file  Transportation Needs: Not on file  Physical Activity: Not on file  Stress: Not on file  Social Connections: Not on file   Family History  Problem Relation Age of Onset   Hypertension Mother    Outpatient Encounter Medications as of 12/07/2020  Medication Sig   Cholecalciferol (VITAMIN D3) 125 MCG (5000 UT) CAPS Take 1 capsule (5,000 Units total) by mouth daily.   [DISCONTINUED] levothyroxine (SYNTHROID) 75 MCG tablet Take 75 mcg by mouth daily before breakfast.   [DISCONTINUED] levothyroxine (SYNTHROID) 88 MCG tablet Take 88 mcg by mouth daily before breakfast.   [DISCONTINUED] losartan (COZAAR) 25 MG tablet Take 25 mg by mouth daily.   ALPRAZolam (XANAX) 0.5 MG tablet Take 0.25 mg by mouth at bedtime as needed for sleep. (Patient not taking: Reported on 12/07/2020)   amLODipine (NORVASC) 10 MG tablet 10 mg daily.    dicyclomine (BENTYL) 20  MG tablet  (Patient not taking: Reported on 12/07/2020)   ferrous sulfate 325 (65 FE) MG EC tablet Take 325 mg by mouth daily with breakfast. (Patient not taking: Reported on 12/07/2020)   HYDROcodone-acetaminophen (NORCO/VICODIN) 5-325 MG per tablet Take 1-2 tablets by mouth every 6 (six) hours as needed for pain. (Patient not taking: Reported on 05/12/2019)   ibuprofen (ADVIL) 800 MG tablet Take 800 mg by mouth 3 (three) times daily as needed.   ketorolac (TORADOL) 10 MG tablet Take 1 tablet (10 mg total) by mouth every 8 (eight) hours as needed. (Patient not taking: Reported on 12/07/2020)   levothyroxine (SYNTHROID) 200 MCG tablet Take 200 mcg by mouth daily.   lisdexamfetamine (VYVANSE) 50 MG capsule Take 50 mg by mouth every morning. (Patient not taking: Reported on 12/07/2020)   losartan-hydrochlorothiazide (HYZAAR) 50-12.5 MG tablet Take 1 tablet by mouth daily.   megestrol (MEGACE) 40 MG tablet 1 tablet daily (Patient not taking: Reported on 12/07/2020)   metFORMIN (GLUCOPHAGE) 500 MG tablet Take 1 tablet by mouth 2 (two) times daily.   metroNIDAZOLE (  METROGEL VAGINAL) 0.75 % vaginal gel Nightly x 5 nights (Patient not taking: Reported on 11/17/2019)   nebivolol (BYSTOLIC) 10 MG tablet Take 10 mg by mouth daily. (Patient not taking: Reported on 12/07/2020)   ondansetron (ZOFRAN) 4 MG tablet Take 1 tablet (4 mg total) by mouth every 8 (eight) hours as needed for nausea or vomiting. (Patient not taking: Reported on 01/02/2018)   promethazine (PHENERGAN) 25 MG tablet Take 1 tablet (25 mg total) by mouth every 6 (six) hours as needed for nausea. (Patient not taking: Reported on 12/07/2020)   [DISCONTINUED] cholecalciferol (VITAMIN D3) 25 MCG (1000 UT) tablet Take 1,000 Units by mouth daily.   [DISCONTINUED] hydrochlorothiazide (HYDRODIURIL) 25 MG tablet Take 25 mg by mouth daily. (Patient not taking: Reported on 12/07/2020)   [DISCONTINUED] levothyroxine (SYNTHROID) 88 MCG tablet Take 88 mcg by mouth  daily.   [DISCONTINUED] Vitamin D, Ergocalciferol, (DRISDOL) 50000 UNITS CAPS Take 50,000 Units by mouth every 7 (seven) days. Takes on Monday.   No facility-administered encounter medications on file as of 12/07/2020.   ALLERGIES: No Known Allergies  VACCINATION STATUS:  There is no immunization history on file for this patient.  HPI Karen Cruz is 50 y.o. female who presents today with a medical history as above. 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   History is obtained directly from the patient as well as chart review.  According to her report she was treated with RAI at approximate age of 70 years.  She was given various doses of thyroid hormone over the years.  She is currently on 288 mcg of levothyroxine.  Admittedly, she is not consistent taking her medication.  Her thyroid function tests have been fluctuating mostly indicating under replacement.  Her most recent TSH was 28.7 She reports fluctuating body weight and energy level.  She denies palpitations, tremors, nor heat intolerance.  She denies family history of thyroid dysfunction.  She denies family or thyroid malignancy.  She was also diagnosed with prediabetes 2 to 3 years ago.  She is currently on metformin 500 mg p.o. twice daily.  Her most recent A1c was 6.4%.  She is taking vitamin D 50,000 units weekly x2 years.  Her most recent labs show vitamin D replete at 42.3. She also has uncontrolled hypertension, currently on losartan 25 mg p.o. daily as well as Norvasc 10 mg p.o. once daily. She admits to follow unhealthy diet.  She did not make concrete effort to lose weight.  Review of Systems  Constitutional: + Minimally fluctuating body weight,  + fatigue, no subjective hyperthermia, no subjective hypothermia Eyes: no blurry vision, no xerophthalmia ENT: no sore throat, no nodules palpated in throat, no dysphagia/odynophagia,  no hoarseness Cardiovascular: no Chest Pain, no Shortness of Breath, no palpitations, no leg swelling Respiratory: no cough, no shortness of breath Gastrointestinal: no Nausea/Vomiting/Diarhhea Musculoskeletal: no muscle/joint aches Skin: no rashes Neurological: no tremors, no numbness, no tingling, no dizziness Psychiatric: no depression, no anxiety  Objective:    Vitals with BMI 12/07/2020 11/17/2019 10/26/2019  Height 5\' 1"  5\' 1"  5\' 1"   Weight 238 lbs 3 oz 237 lbs 240 lbs  BMI 45.03 89.2 11.94  Systolic 174 081 448  Diastolic 96 98 96  Pulse 67 79 84    BP (!) 152/96   Pulse 67   Ht 5\' 1"  (1.549 m)   Wt 238 lb 3.2 oz (108 kg)   BMI 45.01 kg/m  Wt Readings from Last 3 Encounters:  12/07/20 238 lb 3.2 oz (108 kg)  11/17/19 237 lb (107.5 kg)  10/26/19 240 lb (108.9 kg)    Physical Exam  Constitutional:  Body mass index is 45.01 kg/m.,  not in acute distress, normal state of mind Eyes: PERRLA, EOMI, no exophthalmos ENT: moist mucous membranes, no gross thyromegaly, no gross cervical lymphadenopathy Cardiovascular: normal precordial activity, Regular Rate and Rhythm, no Murmur/Rubs/Gallops Respiratory:  adequate breathing efforts, no gross chest deformity, Clear to auscultation bilaterally Gastrointestinal: abdomen soft, Non -tender, No distension, Bowel Sounds present, no gross organomegaly Musculoskeletal: no gross deformities, strength intact in all four extremities Skin: moist, warm, no rashes Neurological: no tremor with outstretched hands, Deep tendon reflexes normal in bilateral lower extremities.  CMP ( most recent) CMP     Component Value Date/Time   NA 137 05/16/2019 0146   K 3.5 05/16/2019 0146   CL 102 05/16/2019 0146   CO2 25 05/16/2019 0146   GLUCOSE 97 05/16/2019 0146   BUN 19 05/16/2019 0146   CREATININE 0.74 05/16/2019 0146   CALCIUM 8.6 (L) 05/16/2019 0146   PROT 7.5 05/12/2019 0815   ALBUMIN 3.6 05/12/2019 0815   AST 14 (L) 05/12/2019 0815    ALT 12 05/12/2019 0815   ALKPHOS 48 05/12/2019 0815   BILITOT 0.8 05/12/2019 0815   GFRNONAA >60 05/16/2019 0146   GFRAA >60 05/16/2019 0146     Diabetic Labs (most recent): Lab Results  Component Value Date   HGBA1C 6.4 10/31/2020     Lipid Panel ( most recent) Lipid Panel  No results found for: CHOL, TRIG, HDL, CHOLHDL, VLDL, LDLCALC, LDLDIRECT, LABVLDL    Lab Results  Component Value Date   TSH 28.70 (A) 10/31/2020    Assessment & Plan:   1. Postablative hypothyroidism 2. Prediabetes 3. Vitamin D deficiency   - Edlin Ford  is being seen at a kind request of Pacifico, [place], [place]. - I have reviewed her available endocrine records and clinically evaluated the patient. - Based on these reviews, she has RAI induced hypothyroidism, prediabetes, and vitamin D deficiency.   -Regarding hypothyroidism, her major problem is inconsistent intake of her medication.  It is unsafe to escalate the dose of levothyroxine.  I approach her for better consistency and lower dose of levothyroxine and she is seems to be in agreement. I discussed and lowered her levothyroxine to 200 mcg p.o. daily before breakfast.   - We discussed about the correct intake of her thyroid hormone, on empty stomach at fasting, with water, separated by at least 30 minutes from breakfast and other medications,  and separated by more than 4 hours from calcium, iron, multivitamins, acid reflux medications (PPIs). -Patient is made aware of the fact that thyroid hormone replacement is needed for life, dose to be adjusted by periodic monitoring of thyroid function tests.   -Regarding her vitamin D deficiency, her recent labs showed replete at 31.  She is allowed to finish her current supplies of vitamin D2 50,000 units weekly.  After that she will maintain with vitamin D3 5000 units daily.  Regarding her prediabetes: Her recent A1c was 6.4%. She will continue to benefit from metformin, 500 mg p.o. twice daily.  She  would benefit the most from dietary modification and weight loss.  - she acknowledges that there is a room for improvement in her food and drink choices. - Suggestion is made for her to avoid simple carbohydrates  from her diet including Cakes, Sweet  Desserts, Ice Cream, Soda (diet and regular), Sweet Tea, Candies, Chips, Cookies, Store Bought Juices, Alcohol in Excess of  1-2 drinks a day, Artificial Sweeteners,  Coffee Creamer, and "Sugar-free" Products, Lemonade. This will help patient to have more stable blood glucose profile and potentially avoid unintended weight gain.   She will return in 3 months with labs. - she is advised to maintain close follow up with Francis Gaines, FNP for primary care needs.   - Time spent with the patient: 60 minutes, of which >50% was spent in  counseling her about her RAI induced hypothyroidism, prediabetes, vitamin D deficiency and the rest in obtaining information about her symptoms, reviewing her previous labs/studies ( including abstractions from other facilities),  evaluations, and treatments,  and developing a plan to confirm diagnosis and long term treatment based on the latest standards of care/guidelines; and documenting her care.  Karen Cruz participated in the discussions, expressed understanding, and voiced agreement with the above plans.  All questions were answered to her satisfaction. she is encouraged to contact clinic should she have any questions or concerns prior to her return visit.  Follow up plan: Return in about 3 months (around 03/09/2021) for F/U with Pre-visit Labs, A1c -NV.   Glade Lloyd, MD [facility] [address] [place], [place] [address] Phone: [phone]  Fax: [phone]     12/07/2020, 6:32 PM  This note was partially dictated with voice recognition software. Similar sounding words can be transcribed inadequately or may not  be corrected upon review.

## 2021-03-09 ENCOUNTER — Ambulatory Visit: Payer: 59 | Admitting: "Endocrinology

## 2021-03-13 ENCOUNTER — Ambulatory Visit: Payer: 59 | Admitting: "Endocrinology

## 2021-03-28 LAB — HEMOGLOBIN A1C: Hemoglobin A1C: 6.1

## 2021-03-28 LAB — VITAMIN D 25 HYDROXY (VIT D DEFICIENCY, FRACTURES): Vit D, 25-Hydroxy: 32.3

## 2021-03-28 LAB — TSH: TSH: 2.17 (ref 0.41–5.90)

## 2021-03-28 IMAGING — CT CT ABD-PELV W/ CM
2 of 5 series · 14 of 46 positions shown, 16 images · IV contrast (Omnipaque or Isovue)
Comparison: CT 12/31/2017

CLINICAL DATA: Generalized abdominal pain. Nausea. Diverticulitis
suspected.

EXAM:
CT ABDOMEN AND PELVIS WITH CONTRAST
TECHNIQUE: Multidetector CT imaging of the abdomen and pelvis was performed
using the standard protocol following bolus administration of
intravenous contrast.
CONTRAST:  100mL OMNIPAQUE IOHEXOL 300 MG/ML  SOLN

[Series 2: coronal st · coronal · 0.78mm/px · 3 of 101 slices shown]
[im 34/101  soft-tissue]
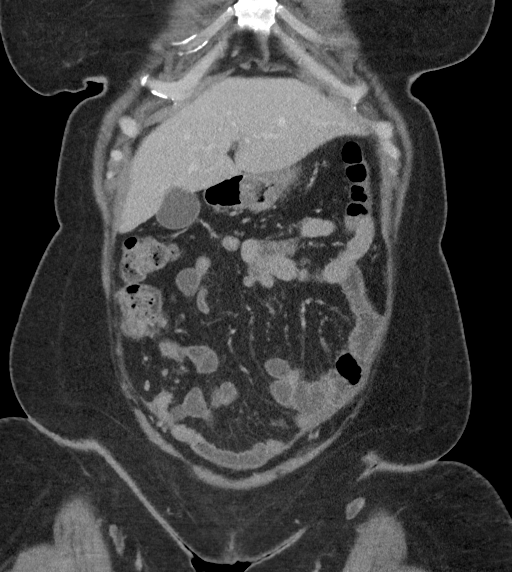
[im 45/101  soft-tissue]
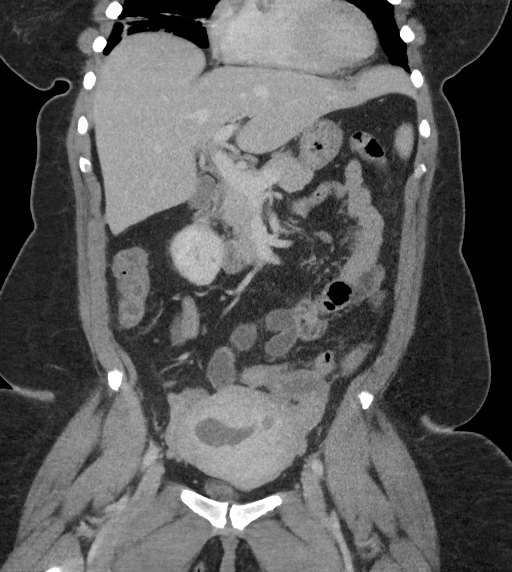
[im 56/101  soft-tissue]
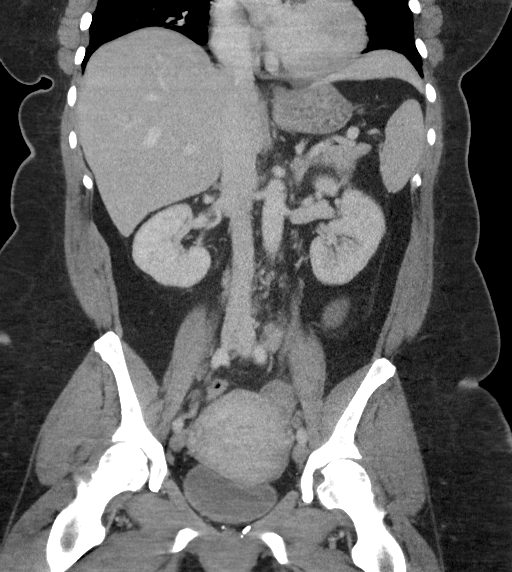

[Series 4: axial st · axial · 0.77mm/px · z∈[-405,-35]mm · 11 of 90 slices shown, 13 images]
[im 8/90  soft-tissue]
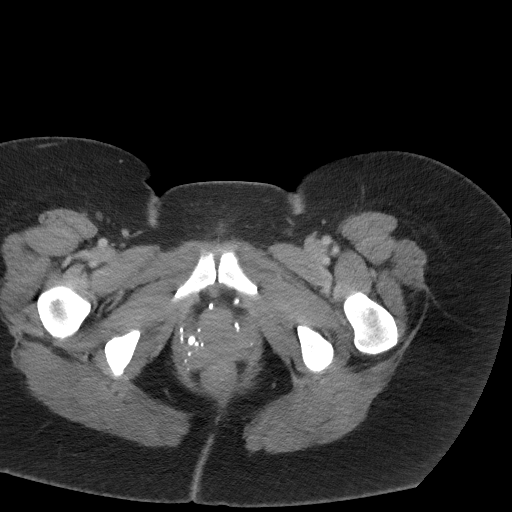
[im 8/90  bone]
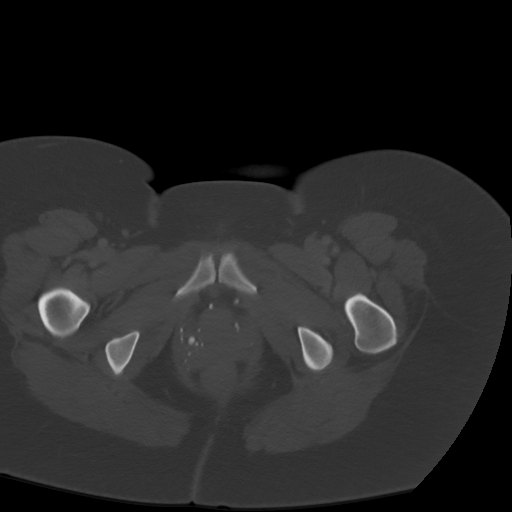
[im 15/90  soft-tissue]
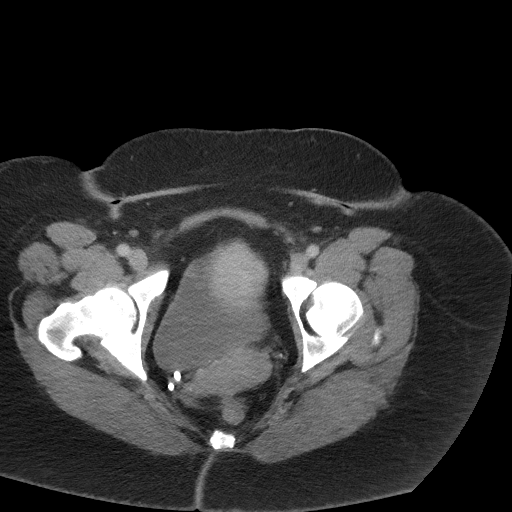
[im 23/90  soft-tissue]
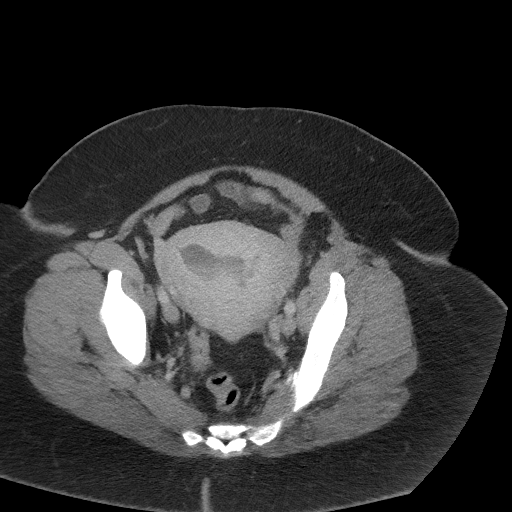
[im 30/90  soft-tissue]
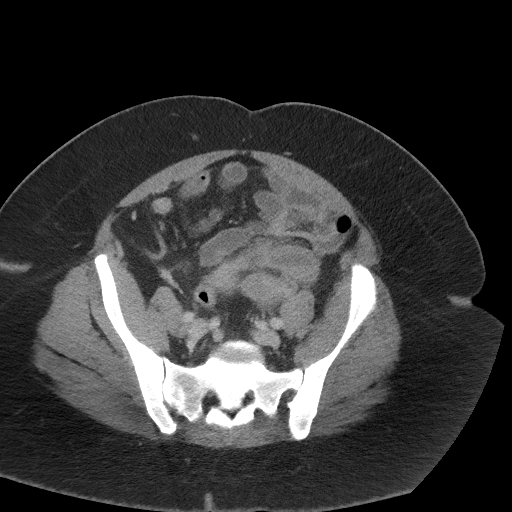
[im 38/90  soft-tissue]
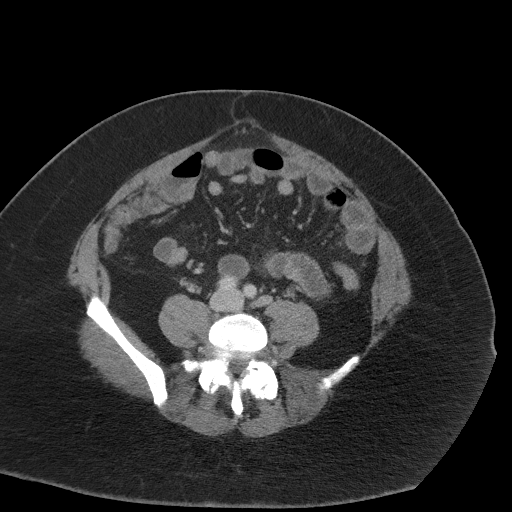
[im 45/90  soft-tissue]
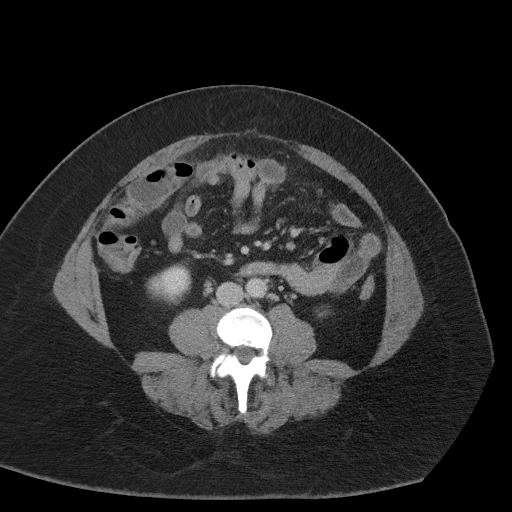
[im 52/90  soft-tissue]
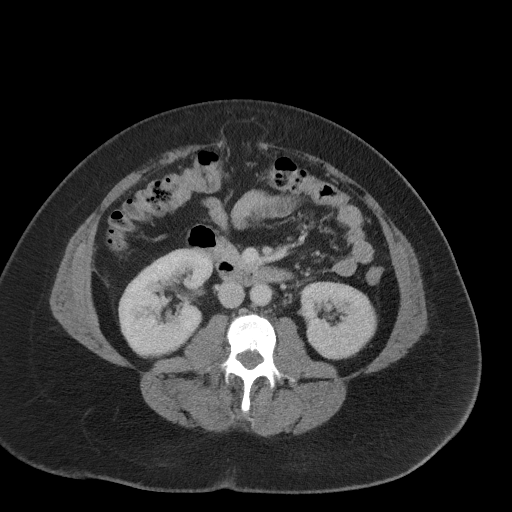
[im 60/90  soft-tissue]
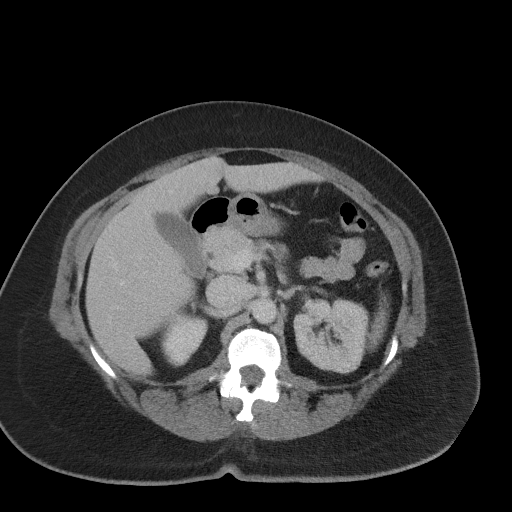
[im 67/90  soft-tissue]
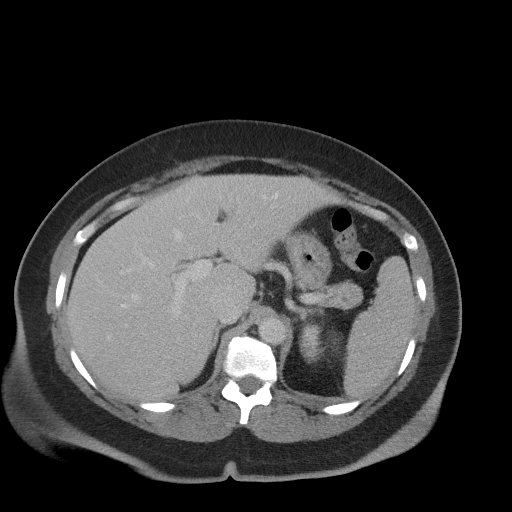
[im 67/90  bone]
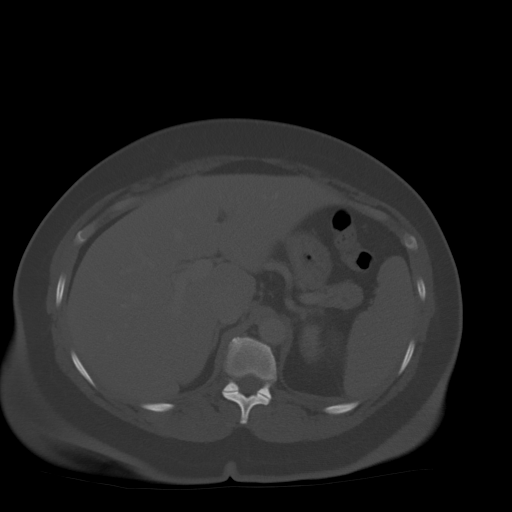
[im 75/90  soft-tissue]
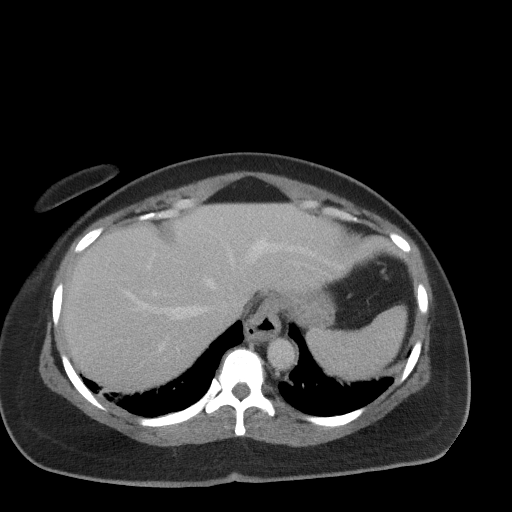
[im 82/90  soft-tissue]
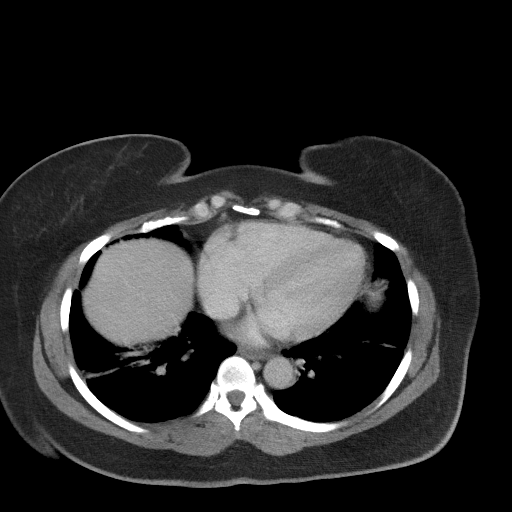

[14 of 46 positions shown; findings below may reference images not displayed]

FINDINGS: Lower chest: Linear and bandlike atelectasis or scarring in both
lung bases. No pleural fluid.

Hepatobiliary: No focal liver abnormality is seen. No gallstones,
gallbladder wall thickening, or biliary dilatation.

Pancreas: No ductal dilatation or inflammation.

Spleen: Normal in size without focal abnormality.

Adrenals/Urinary Tract: Normal adrenal glands. No hydronephrosis or
perinephric edema. Homogeneous renal enhancement with symmetric
excretion on delayed phase imaging. Nonobstructing stones in the
lower left kidney, largest measuring 10 mm. Punctate nonobstructing
stone in the mid right kidney. Tiny cortical hypodensity in the left
mid kidney is too small to characterize. Urinary bladder is
physiologically distended without wall thickening.

Stomach/Bowel: Small hiatal hernia. Stomach otherwise decompressed.
Fluid-filled nondilated small bowel in the lower abdomen. Suggestion
of mild small bowel wall thickening and perienteric inflammation in
the left lower quadrant, series 4, image 62. No obstruction. Normal
appendix. Small volume of colonic stool. No colonic wall thickening
or inflammation. No significant diverticular disease.

Vascular/Lymphatic: Abdominal aorta is normal in caliber. Portal
vein is patent. Prominent 9 mm left external iliac node series 4,
image 69. No upper abdominal adenopathy.

Reproductive: Enlarged uterus with ill-defined fluid/low-density in
the endometrial canal. Degree of fluid is increased from prior CT.
Slight soft tissue prominence in the cervix. No definite adnexal
mass.

Other: Small amount of free fluid in the pelvis, slightly increased
from prior. No upper abdominal ascites. Supraumbilical ventral
abdominal wall hernia contains fat, previous transverse colon
involvement has resolved. Additional complex umbilical and
supraumbilical fat containing abdominal wall hernias. No bowel
involvement or inflammation.

Musculoskeletal: Degenerative change in the lumbar spine with
primarily facet hypertrophy. Stable bone island in the right
proximal femur. There are no acute or suspicious osseous
abnormalities.
IMPRESSION: 1. Fluid-filled small bowel with short segment small bowel wall
thickening or perienteric stranding, suggesting enteritis. This may
be infectious or inflammatory. No bowel obstruction.
2. Enlarged uterus with ill-defined fluid/low-density in the
endometrial canal. The degree of endometrial fluid has increased
from CT 16 months prior. Well endometrial findings may be related to
prior endometrial ablation, recommend nonemergent pelvic ultrasound
for further characterization to assess for endometrial thickening.
3. Nonobstructing bilateral nephrolithiasis.
4. Multiple ventral abdominal wall hernias containing fat, no bowel
involvement or inflammation.

## 2021-05-15 ENCOUNTER — Emergency Department (HOSPITAL_COMMUNITY): Payer: Medicaid - Out of State

## 2021-05-15 ENCOUNTER — Other Ambulatory Visit: Payer: Self-pay

## 2021-05-15 ENCOUNTER — Emergency Department (HOSPITAL_COMMUNITY)
Admission: EM | Admit: 2021-05-15 | Discharge: 2021-05-15 | Disposition: A | Discharge status: Home / Self Care | Payer: Medicaid - Out of State | Attending: Emergency Medicine | Admitting: Emergency Medicine

## 2021-05-15 DIAGNOSIS — E039 Hypothyroidism, unspecified: Secondary | ICD-10-CM | POA: Diagnosis not present

## 2021-05-15 DIAGNOSIS — I10 Essential (primary) hypertension: Secondary | ICD-10-CM | POA: Diagnosis not present

## 2021-05-15 DIAGNOSIS — R059 Cough, unspecified: Secondary | ICD-10-CM | POA: Diagnosis present

## 2021-05-15 DIAGNOSIS — J40 Bronchitis, not specified as acute or chronic: Secondary | ICD-10-CM | POA: Diagnosis not present

## 2021-05-15 DIAGNOSIS — Z79899 Other long term (current) drug therapy: Secondary | ICD-10-CM | POA: Diagnosis not present

## 2021-05-15 DIAGNOSIS — Z7984 Long term (current) use of oral hypoglycemic drugs: Secondary | ICD-10-CM | POA: Diagnosis not present

## 2021-05-15 DIAGNOSIS — Z20822 Contact with and (suspected) exposure to covid-19: Secondary | ICD-10-CM | POA: Insufficient documentation

## 2021-05-15 LAB — RESP PANEL BY RT-PCR (FLU A&B, COVID) ARPGX2
Influenza A by PCR: NEGATIVE
Influenza B by PCR: NEGATIVE
SARS Coronavirus 2 by RT PCR: NEGATIVE

## 2021-05-15 MED ORDER — ALBUTEROL SULFATE HFA 108 (90 BASE) MCG/ACT IN AERS
1.0000 | INHALATION_SPRAY | Freq: Once | RESPIRATORY_TRACT | Status: AC
  Administered 2021-05-15: 2 via RESPIRATORY_TRACT
  Filled 2021-05-15: qty 6.7

## 2021-05-15 MED ORDER — DEXAMETHASONE 4 MG PO TABS
4.0000 mg | ORAL_TABLET | Freq: Once | ORAL | Status: AC
Start: 2021-05-15 — End: 2021-05-15
  Administered 2021-05-15: 4 mg via ORAL
  Filled 2021-05-15: qty 1

## 2021-05-15 MED ORDER — BENZONATATE 100 MG PO CAPS: 100.0000 mg | ORAL_CAPSULE | Freq: Three times a day (TID) | ORAL | 0 refills | Status: AC

## 2021-05-15 MED ORDER — IPRATROPIUM-ALBUTEROL 0.5-2.5 (3) MG/3ML IN SOLN
3.0000 mL | Freq: Once | RESPIRATORY_TRACT | Status: AC
  Administered 2021-05-15: 3 mL via RESPIRATORY_TRACT
  Filled 2021-05-15: qty 3

## 2021-05-15 NOTE — ED Provider Notes (Signed)
[facility] EMERGENCY DEPARTMENT Provider Note   CSN: [id number] Arrival date & time: 05/15/21  1323     History No chief complaint on file.   Merita Hawks is a 50 y.o. female [facility] emergency department for evaluation of throat, cough, nasal congestion, ear pain for the past week.  Patient reports cough is productive with a thick yellow sputum.  She denies any chest pain, shortness of breath, weakness, headache, difficulty eating or drinking.  She was seen at urgent care on Friday and given 20 mg of prednisone for 5 days along with promethazine syrup.  Patient reports she has also tried TheraFlu and Alka-Seltzer cold and sinus and has not had any relief of symptoms.  She reports she had a fever either Friday or Saturday that was 1-2.7, otherwise has not had a recorded temperature.  Medical history includes hypertension, hypothyroidism, and prediabetes.  No known drug allergies.  Patient denies any tobacco, EtOH, or drug use.  HPI     Past Medical History:  Diagnosis Date   Anemia    Depression    Hypertension    Hypothyroidism    Thyroid disease     Patient Active Problem List   Diagnosis Date Noted   Prediabetes 12/07/2020   Vitamin D deficiency 12/07/2020   Enteritis 05/12/2019   Hypothyroidism 05/12/2019   Diarrhea    Left lower quadrant abdominal pain    Hypertension    Depression     Past Surgical History:  Procedure Laterality Date   TUBAL LIGATION     uterine ablasion       OB History     Gravida  4   Para  3   Term  3   Preterm      AB  1   Living  3      SAB      IAB      Ectopic      Multiple      Live Births              Family History  Problem Relation Age of Onset   Hypertension Mother     Social History   Tobacco Use   Smoking status: Never   Smokeless tobacco: Never  Substance Use Topics   Alcohol use: Yes    Comment: socially   Drug use: No    Home Medications Prior to Admission medications   Medication  Sig Start Date End Date Taking? Authorizing Provider  benzonatate (TESSALON) 100 MG capsule Take 1 capsule (100 mg total) by mouth every 8 (eight) hours. 05/15/21  Yes Sherrell Puller, PA-C  ALPRAZolam Duanne Moron) 0.5 MG tablet Take 0.25 mg by mouth at bedtime as needed for sleep. Patient not taking: Reported on 12/07/2020    [provider]  amLODipine (NORVASC) 10 MG tablet 10 mg daily.  01/10/18   [provider]  Cholecalciferol (VITAMIN D3) 125 MCG (5000 UT) CAPS Take 1 capsule (5,000 Units total) by mouth daily. 12/07/20   Cassandria Anger, MD  dicyclomine (BENTYL) 20 MG tablet  02/13/18   [provider]  ferrous sulfate 325 (65 FE) MG EC tablet Take 325 mg by mouth daily with breakfast. Patient not taking: Reported on 12/07/2020    [provider]  HYDROcodone-acetaminophen (NORCO/VICODIN) 5-325 MG per tablet Take 1-2 tablets by mouth every 6 (six) hours as needed for pain. Patient not taking: Reported on 05/12/2019 10/09/12   Fredia Sorrow, MD  ibuprofen (ADVIL) 800 MG tablet Take 800  mg by mouth 3 (three) times daily as needed. 01/28/19   [provider]  ketorolac (TORADOL) 10 MG tablet Take 1 tablet (10 mg total) by mouth every 8 (eight) hours as needed. Patient not taking: Reported on 12/07/2020 11/17/19   Florian Buff, MD  levothyroxine (SYNTHROID) 200 MCG tablet Take 200 mcg by mouth daily. 01/28/19   [provider]  lisdexamfetamine (VYVANSE) 50 MG capsule Take 50 mg by mouth every morning. Patient not taking: Reported on 12/07/2020    [provider]  losartan-hydrochlorothiazide (HYZAAR) 50-12.5 MG tablet Take 1 tablet by mouth daily. 11/29/20   [provider]  megestrol (MEGACE) 40 MG tablet 1 tablet daily Patient not taking: Reported on 12/07/2020 01/02/18   Florian Buff, MD  metFORMIN (GLUCOPHAGE) 500 MG tablet Take 1 tablet by mouth 2 (two) times daily. 11/29/20   [provider]  metroNIDAZOLE (METROGEL  VAGINAL) 0.75 % vaginal gel Nightly x 5 nights Patient not taking: Reported on 11/17/2019 04/17/18   Florian Buff, MD  nebivolol (BYSTOLIC) 10 MG tablet Take 10 mg by mouth daily. Patient not taking: Reported on 12/07/2020    [provider]  ondansetron (ZOFRAN) 4 MG tablet Take 1 tablet (4 mg total) by mouth every 8 (eight) hours as needed for nausea or vomiting. Patient not taking: Reported on 01/02/2018 12/31/17   Rolland Porter, MD  promethazine (PHENERGAN) 25 MG tablet Take 1 tablet (25 mg total) by mouth every 6 (six) hours as needed for nausea. Patient not taking: Reported on 12/07/2020 10/09/12   Fredia Sorrow, MD    Allergies    Patient has no known allergies.  Review of Systems   Review of Systems  Constitutional:  Positive for fever. Negative for chills.  HENT:  Positive for congestion, ear pain and sore throat. Negative for drooling, rhinorrhea and trouble swallowing.   Eyes:  Negative for photophobia, discharge and visual disturbance.  Respiratory:  Positive for cough. Negative for shortness of breath.   Cardiovascular:  Negative for chest pain and palpitations.  Gastrointestinal:  Negative for abdominal pain, diarrhea, nausea and vomiting.  Genitourinary:  Negative for dysuria and hematuria.  Musculoskeletal:  Negative for arthralgias, back pain, joint swelling and myalgias.  Skin:  Negative for color change and rash.  Neurological:  Negative for syncope and weakness.  All other systems reviewed and are negative.  Physical Exam Updated Vital Signs BP (!) 139/94 (BP Location: Right Arm)   Pulse 83   Temp 98.4 F (36.9 C) (Oral)   Resp 15   Ht 5\' 1"  (1.549 m)   Wt 106.6 kg   SpO2 95%   BMI 44.40 kg/m   Physical Exam Vitals and nursing note reviewed.  Constitutional:      General: She is not in acute distress.    Appearance: Normal appearance. She is not toxic-appearing.  HENT:     Head: Normocephalic and atraumatic.     Right Ear: Tympanic membrane, ear  canal and external ear normal.     Left Ear: Tympanic membrane, ear canal and external ear normal.     Nose:     Comments: Bilateral nasal edema and erythema with clear scant nasal discharge.    Mouth/Throat:     Mouth: Mucous membranes are moist.     Pharynx: No oropharyngeal exudate or posterior oropharyngeal erythema.     Comments: No pharyngeal erythema, edema, or exudate noted.  Uvula midline.  Airway patent.  Moist mucous membranes. Eyes:  General: No scleral icterus. Cardiovascular:     Rate and Rhythm: Normal rate and regular rhythm.  Pulmonary:     Effort: Pulmonary effort is normal. No respiratory distress.     Breath sounds: Normal breath sounds. No wheezing.     Comments: No respiratory distress, tripoding, accessory muscle use, nasal flaring, or cyanosis present.  Patient speaking in full sentences with ease. Abdominal:     General: Abdomen is flat. Bowel sounds are normal.     Palpations: Abdomen is soft.  Musculoskeletal:        General: No deformity.     Cervical back: Normal range of motion.  Lymphadenopathy:     Cervical: No cervical adenopathy.  Skin:    General: Skin is warm and dry.  Neurological:     General: No focal deficit present.     Mental Status: She is alert. Mental status is at baseline.    ED Results / Procedures / Treatments   Labs (all labs ordered are listed, but only abnormal results are displayed) Labs Reviewed  RESP PANEL BY RT-PCR (FLU A&B, COVID) [id number]    EKG None  Radiology DG Chest 2 View  Result Date: 05/15/2021 CLINICAL DATA:  Cough EXAM: CHEST - 2 VIEW COMPARISON:  05/12/2019 FINDINGS: Chronic scarring or atelectasis at the bases. No consolidation or effusion. Normal cardiomediastinal silhouette contour. No pneumothorax. IMPRESSION: No active cardiopulmonary disease. Scarring or atelectasis at the lung bases Electronically Signed   By: Donavan Foil M.D.   On: 05/15/2021 18:54    Procedures Procedures   Medications  Ordered in ED Medications  ipratropium-albuterol (DUONEB) 0.5-2.5 (3) MG/3ML nebulizer solution 3 mL (3 mLs Nebulization Given 05/15/21 2003)  dexamethasone (DECADRON) tablet 4 mg (4 mg Oral Given 05/15/21 2100)  albuterol (VENTOLIN HFA) 108 (90 Base) MCG/ACT inhaler 1-2 puff (2 puffs Inhalation Given 05/15/21 2101)    ED Course  I have reviewed the triage vital signs and the nursing notes.  Pertinent labs & imaging results that were available during my care of the patient were reviewed by me and considered in my medical decision making (see chart for details).  50 year old female presents emergency department for evaluation of URI for the past week.  Exam unremarkable, other than some bilateral nasal turbinate edema and erythema.  Uvula midline.  Airway patent.  Lungs clear to auscultation bilaterally.  Vital signs stable.  Mildly hypertensive, although patient has a history of hypertension.  Patient was recently seen in urgent care and given prednisone and promethazine syrup without relief.  We swabbed today for COVID and flu which were negative.  Chest x-ray shows no active cardiopulmonary disease although there is some scarring or atelectasis at the lung bases.  Will order patient DuoNeb and reevaluate.  Patient was also ordered dexamethasone one-time dose here in the emergency department.  Patient afebrile in the emergency department, low suspicion for pneumonia given chest x-ray, lung exam, and vital signs.  On reevaluation, patient reports she is feeling better after the DuoNeb.  Will order albuterol inhaler for her to take home.  I discussed the lab and imaging findings with patient.  Reported this is likely still bronchitis but will prescribe her additional medication such as a symptom inhaler of albuterol, a one-time stronger steroid dose, and Tessalon Perles to take in conjunction.  Recommended that she follow-up with her PCP for evaluation if there is no resolution of symptoms.  I  advised her if she has any concern, new or worsening symptoms, to return  to the emergency department for reevaluation.  Patient agrees to plan.  Patient is stable being discharged home in good condition.    MDM Rules/Calculators/A&P                          Final Clinical Impression(s) / ED Diagnoses Final diagnoses:  Bronchitis    Rx / DC Orders ED Discharge Orders          Ordered    benzonatate (TESSALON) 100 MG capsule  Every 8 hours        05/15/21 2101             Sherrell Puller, PA-C 05/15/21 2101    Milton Ferguson, MD 05/16/21 1452

## 2021-05-15 NOTE — ED Triage Notes (Signed)
Pt was seen at urgent care on Friday and dx with bronchitis. Pt was given prednisone and cough syrup. Pt states pain is worse, productive cough and sore throat.

## 2021-05-15 NOTE — Discharge Instructions (Addendum)
You are seen here today for evaluation of your URI symptoms.  You had a chest ray that showed some bilateral scarring with some perihilar thickening consistent with bronchitis.  You were given a DuoNeb treatment today.  I am glad you found relief with this.  I will send you home with an albuterol inhaler to take as needed.  Initially, you were given a one-time steroid dose of dexamethasone here, please continue your prednisone pack that was given to you at urgent care.  I have also prescribed you Tessalon Perles which are an antitussive medication to take as needed.  Please follow directions on prescription bottles.  Please follow-up with your PCP within the week for reevaluation and possible repeat chest x-ray.  If you have any concern, new or worsening symptoms, please return to the nearest emergency department for reevaluation.

## 2021-05-24 ENCOUNTER — Encounter: Payer: Self-pay | Admitting: "Endocrinology

## 2021-05-24 ENCOUNTER — Telehealth: Payer: Self-pay | Admitting: "Endocrinology

## 2021-05-24 ENCOUNTER — Ambulatory Visit (INDEPENDENT_AMBULATORY_CARE_PROVIDER_SITE_OTHER): Payer: Self-pay | Admitting: "Endocrinology

## 2021-05-24 ENCOUNTER — Other Ambulatory Visit: Payer: Self-pay

## 2021-05-24 VITALS — BP 108/76 | HR 84 | Ht 61.0 in | Wt 236.6 lb

## 2021-05-24 DIAGNOSIS — R7303 Prediabetes: Secondary | ICD-10-CM

## 2021-05-24 DIAGNOSIS — E890 Postprocedural hypothyroidism: Secondary | ICD-10-CM

## 2021-05-24 DIAGNOSIS — E559 Vitamin D deficiency, unspecified: Secondary | ICD-10-CM

## 2021-05-24 NOTE — Telephone Encounter (Signed)
Patient has [facility], we do not accept this insurance at [employer] Patient made aware at visit on 05/24/21

## 2021-05-24 NOTE — Progress Notes (Signed)
05/24/2021, 2:17 PM  Endocrinology follow-up note   Subjective:    Patient ID: Karen Cruz, female    DOB: [birth year], PCP Pacifico, Micah, FNP   Past Medical History:  Diagnosis Date   Anemia    Depression    Hypertension    Hypothyroidism    Thyroid disease    Past Surgical History:  Procedure Laterality Date   TUBAL LIGATION     uterine ablasion     Social History   Socioeconomic History   Marital status: Married    Spouse name: Not on file   Number of children: Not on file   Years of education: Not on file   Highest education level: Not on file  Occupational History   Not on file  Tobacco Use   Smoking status: Never   Smokeless tobacco: Never  Substance and Sexual Activity   Alcohol use: Yes    Comment: socially   Drug use: No   Sexual activity: Yes    Birth control/protection: None  Other Topics Concern   Not on file  Social History Narrative   Not on file   Social Determinants of Health   Financial Resource Strain: Not on file  Food Insecurity: Not on file  Transportation Needs: Not on file  Physical Activity: Not on file  Stress: Not on file  Social Connections: Not on file   Family History  Problem Relation Age of Onset   Hypertension Mother    Outpatient Encounter Medications as of 05/24/2021  Medication Sig   Albuterol Sulfate (PROAIR RESPICLICK) 456 (90 Base) MCG/ACT AEPB Inhale 2 puffs into the lungs every 4 (four) hours as needed.   Cholecalciferol (VITAMIN D) 50 MCG (2000 UT) CAPS Take 2,000 Units by mouth daily with lunch.   ketorolac (TORADOL) 10 MG tablet Take 1 tablet (10 mg total) by mouth every 8 (eight) hours as needed.   amLODipine (NORVASC) 10 MG tablet 10 mg daily.    benzonatate (TESSALON) 100 MG capsule Take 1 capsule (100 mg total) by mouth every 8 (eight) hours.   ibuprofen (ADVIL) 800 MG tablet Take 800 mg by mouth 3 (three) times daily as needed.    levothyroxine (SYNTHROID) 200 MCG tablet Take 200 mcg by mouth daily.   losartan-hydrochlorothiazide (HYZAAR) 50-12.5 MG tablet Take 1 tablet by mouth daily.   metFORMIN (GLUCOPHAGE) 500 MG tablet Take 1 tablet by mouth 2 (two) times daily.   [DISCONTINUED] ALPRAZolam (XANAX) 0.5 MG tablet Take 0.25 mg by mouth at bedtime as needed for sleep. (Patient not taking: Reported on 12/07/2020)   [DISCONTINUED] Cholecalciferol (VITAMIN D3) 125 MCG (5000 UT) CAPS Take 1 capsule (5,000 Units total) by mouth daily. (Patient not taking: Reported on 05/24/2021)   [DISCONTINUED] dicyclomine (BENTYL) 20 MG tablet  (Patient not taking: Reported on 12/07/2020)   [DISCONTINUED] ferrous sulfate 325 (65 FE) MG EC tablet Take 325 mg by mouth daily with breakfast. (Patient not taking: Reported on 12/07/2020)   [DISCONTINUED] HYDROcodone-acetaminophen (NORCO/VICODIN) 5-325 MG per tablet Take 1-2 tablets by mouth every 6 (six) hours as needed for pain. (Patient not taking: Reported on 05/12/2019)   [DISCONTINUED] lisdexamfetamine (VYVANSE) 50 MG capsule Take 50 mg by mouth every morning. (Patient not taking:  Reported on 12/07/2020)   [DISCONTINUED] megestrol (MEGACE) 40 MG tablet 1 tablet daily (Patient not taking: Reported on 12/07/2020)   [DISCONTINUED] metroNIDAZOLE (METROGEL VAGINAL) 0.75 % vaginal gel Nightly x 5 nights (Patient not taking: Reported on 11/17/2019)   [DISCONTINUED] nebivolol (BYSTOLIC) 10 MG tablet Take 10 mg by mouth daily. (Patient not taking: Reported on 12/07/2020)   [DISCONTINUED] ondansetron (ZOFRAN) 4 MG tablet Take 1 tablet (4 mg total) by mouth every 8 (eight) hours as needed for nausea or vomiting. (Patient not taking: Reported on 01/02/2018)   [DISCONTINUED] promethazine (PHENERGAN) 25 MG tablet Take 1 tablet (25 mg total) by mouth every 6 (six) hours as needed for nausea. (Patient not taking: Reported on 12/07/2020)   No facility-administered encounter medications on file as of 05/24/2021.    ALLERGIES: No Known Allergies  VACCINATION STATUS:  There is no immunization history on file for this patient.  HPI Karen Cruz is 50 y.o. female who returns to follow-up.    See notes from previous visits.  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   See notes from previous visits. According to her report she was treated with RAI at approximate age of 31 years.  She was given various doses of thyroid hormone over the years.  -During her last visit, she was started on adjusted dose of levothyroxine 200 mcg p.o. daily before breakfast.  She returns with significant symptomatic improvement  -Her previsit thyroid function tests are consistent with appropriate replacement. She has no new complaints.  She denies family history of thyroid dysfunction.  She denies family or thyroid malignancy.  She was also diagnosed with prediabetes 2 to 3 years ago.  She is currently on metformin 500 mg p.o. twice daily.  Her most recent A1c was 6.1% improving from 6.4%.    She is status post vitamin D replacement therapy.    She also has uncontrolled hypertension, currently on losartan 25 mg p.o. daily as well as Norvasc 10 mg p.o. once daily. She admits to follow unhealthy diet.  She did not make concrete effort to lose weight.  Review of Systems  Constitutional: + Minimally fluctuating body weight,  + fatigue, no subjective hyperthermia, no subjective hypothermia Eyes: no blurry vision, no xerophthalmia   Objective:    Vitals with BMI 05/24/2021 05/15/2021 05/15/2021  Height 5\' 1"  - 5\' 1"   Weight 236 lbs 10 oz - 235 lbs  BMI 26.33 - 35.45  Systolic 625 638 -  Diastolic 76 94 -  Pulse 84 83 -    BP 108/76   Pulse 84   Ht 5\' 1"  (1.549 m)   Wt 236 lb 9.6 oz (107.3 kg)   BMI 44.71 kg/m   Wt Readings from Last 3 Encounters:  05/24/21 236 lb 9.6 oz (107.3 kg)  05/15/21 235 lb (106.6 kg)   12/07/20 238 lb 3.2 oz (108 kg)    Physical Exam  Constitutional:  Body mass index is 44.71 kg/m.,  not in acute distress, normal state of mind Eyes: PERRLA, EOMI, no exophthalmos ENT: moist mucous membranes, no gross thyromegaly, no gross cervical lymphadenopathy   CMP ( most recent) CMP     Component Value Date/Time   NA 137 05/16/2019 0146   K 3.5 05/16/2019 0146   CL 102 05/16/2019 0146   CO2 25 05/16/2019 0146   GLUCOSE 97 05/16/2019 0146   BUN 19 05/16/2019 0146   CREATININE 0.74 05/16/2019 0146  CALCIUM 8.6 (L) 05/16/2019 0146   PROT 7.5 05/12/2019 0815   ALBUMIN 3.6 05/12/2019 0815   AST 14 (L) 05/12/2019 0815   ALT 12 05/12/2019 0815   ALKPHOS 48 05/12/2019 0815   BILITOT 0.8 05/12/2019 0815   GFRNONAA >60 05/16/2019 0146   GFRAA >60 05/16/2019 0146     Diabetic Labs (most recent): Lab Results  Component Value Date   HGBA1C 6.1 03/28/2021   HGBA1C 6.4 10/31/2020     Lipid Panel ( most recent) Lipid Panel  No results found for: CHOL, TRIG, HDL, CHOLHDL, VLDL, LDLCALC, LDLDIRECT, LABVLDL    Lab Results  Component Value Date   TSH 2.17 03/28/2021   TSH 28.70 (A) 10/31/2020    Assessment & Plan:   1. Postablative hypothyroidism 2. Prediabetes 3. Vitamin D deficiency  -she has RAI induced hypothyroidism, prediabetes, and vitamin D deficiency.    Her previsit thyroid function tests are consistent with appropriate replacement.  She is advised to continue levothyroxine 200 mcg p.o. daily before breakfast.  - We discussed about the correct intake of her thyroid hormone, on empty stomach at fasting, with water, separated by at least 30 minutes from breakfast and other medications,  and separated by more than 4 hours from calcium, iron, multivitamins, acid reflux medications (PPIs). -Patient is made aware of the fact that thyroid hormone replacement is needed for life, dose to be adjusted by periodic monitoring of thyroid function tests.   -Regarding  her vitamin D deficiency, her recent labs showed replete at 38.  She is is status post vitamin D replacement with high-dose prescriptions.  She is advised to maintain with vitamin D3 2000 units daily.      Regarding her prediabetes: Her recent A1c is improving to 6.1%.   she will continue to benefit from metformin, 500 mg p.o. twice daily.  She would benefit the most from dietary modification and weight loss. - she acknowledges that there is a room for improvement in her food and drink choices. - Suggestion is made for her to avoid simple carbohydrates  from her diet including Cakes, Sweet Desserts, Ice Cream, Soda (diet and regular), Sweet Tea, Candies, Chips, Cookies, Store Bought Juices, Alcohol in Excess of  1-2 drinks a day, Artificial Sweeteners,  Coffee Creamer, and "Sugar-free" Products, Lemonade. This will help patient to have more stable blood glucose profile and potentially avoid unintended weight gain.   She will return in 3 months with labs. - she is advised to maintain close follow up with Francis Gaines, FNP for primary care needs.   I spent 32 minutes in the care of the patient today including review of labs from Thyroid Function, CMP, and other relevant labs ; imaging/biopsy records (current and previous including abstractions from other facilities); face-to-face time discussing  her lab results and symptoms, medications doses, her options of short and long term treatment based on the latest standards of care / guidelines;   and documenting the encounter.  Karen Cruz  participated in the discussions, expressed understanding, and voiced agreement with the above plans.  All questions were answered to her satisfaction. she is encouraged to contact clinic should she have any questions or concerns prior to her return visit.   Follow up plan: Return in about 6 months (around 11/21/2021) for F/U with Pre-visit Labs, A1c -NV.  Glade Lloyd, MD [facility] [address] [place], [place] [address] Phone: [phone]  Fax: [phone]     05/24/2021, 2:17  PM  This note was partially dictated with voice recognition software. Similar sounding words can be transcribed inadequately or may not  be corrected upon review.

## 2021-05-24 NOTE — Patient Instructions (Signed)

## 2021-11-21 ENCOUNTER — Ambulatory Visit: Payer: Self-pay | Admitting: "Endocrinology

## 2023-04-01 IMAGING — DX DG CHEST 2V
2 series · 2 of 2 positions shown · non-contrast
Comparison: 05/12/2019

CLINICAL DATA: Cough

EXAM:
CHEST - 2 VIEW

[chest pa]
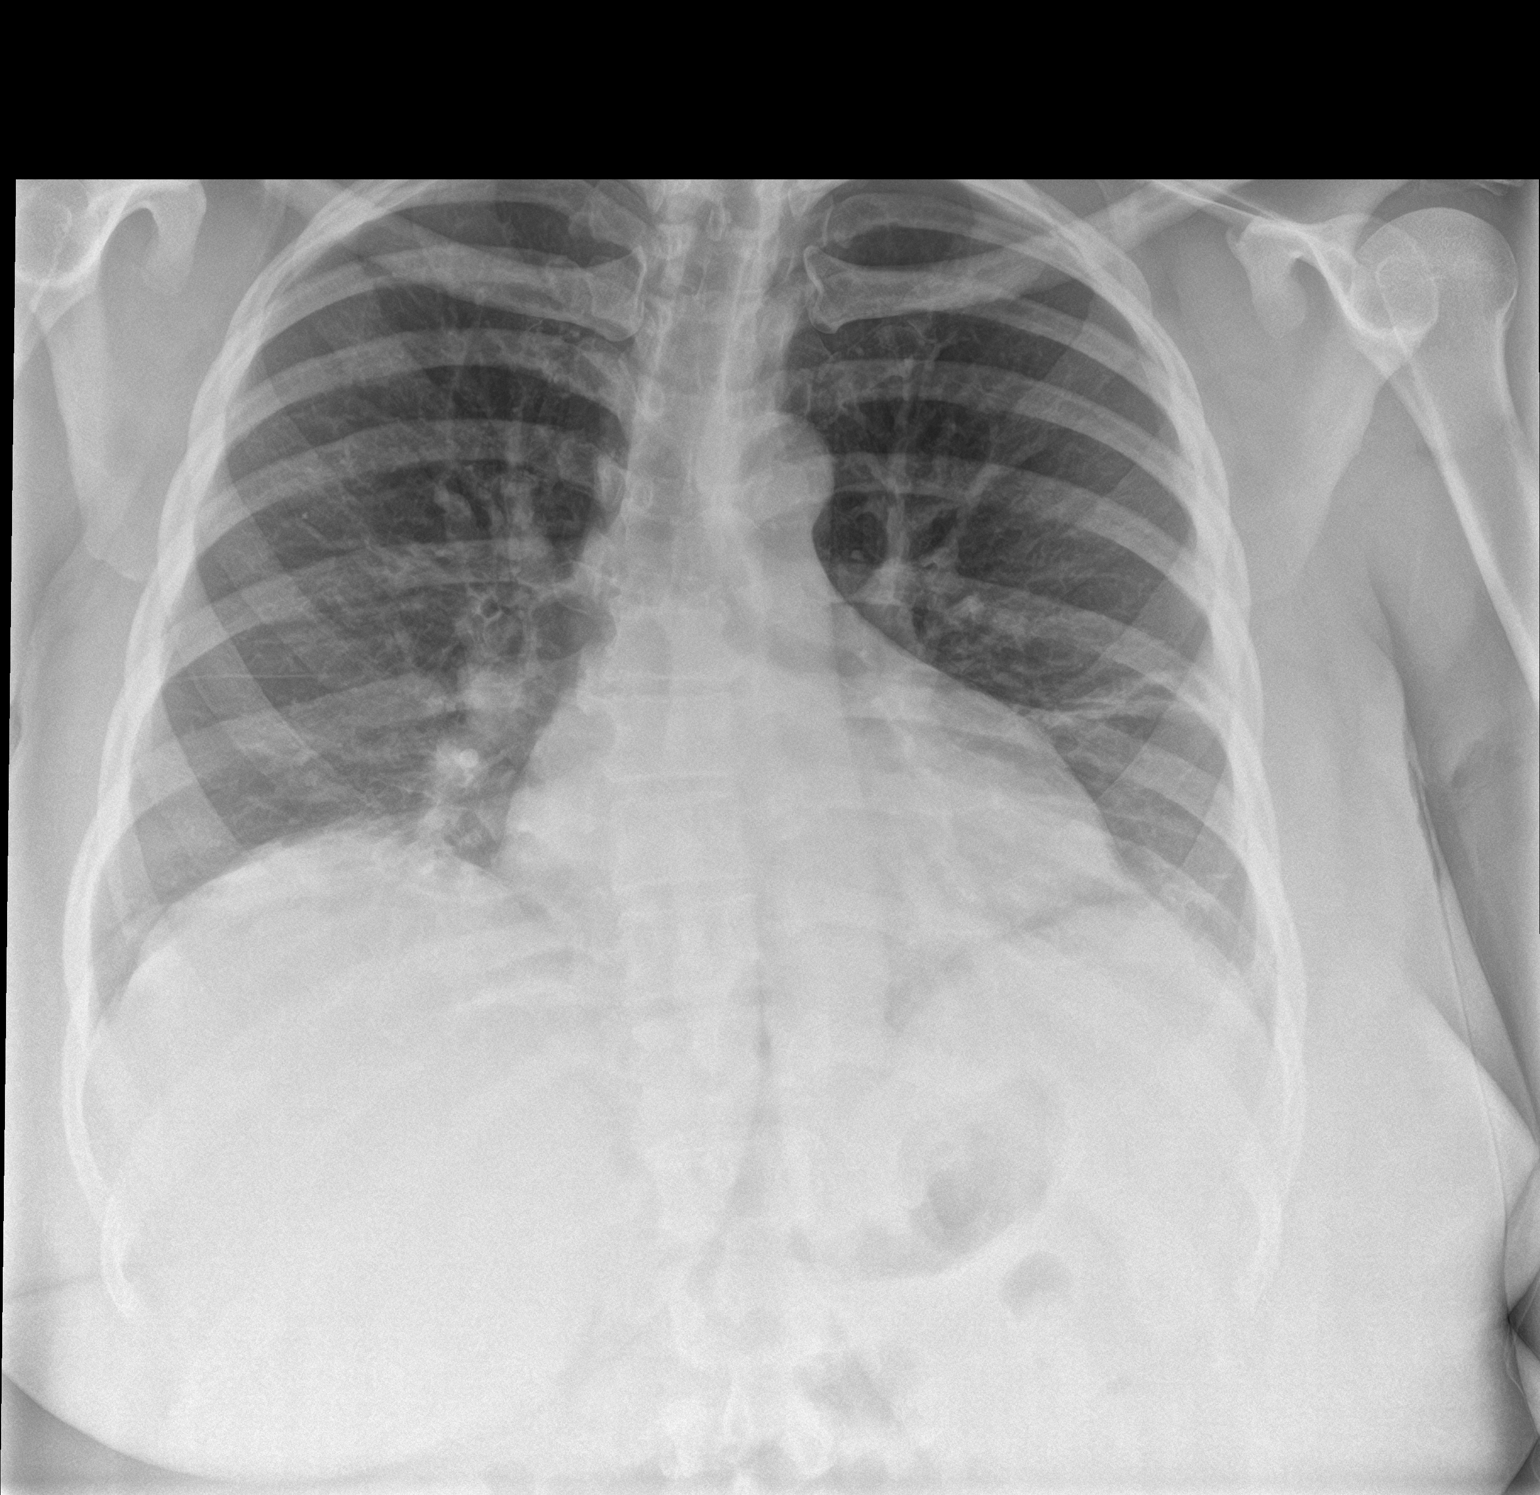

[chest lat]
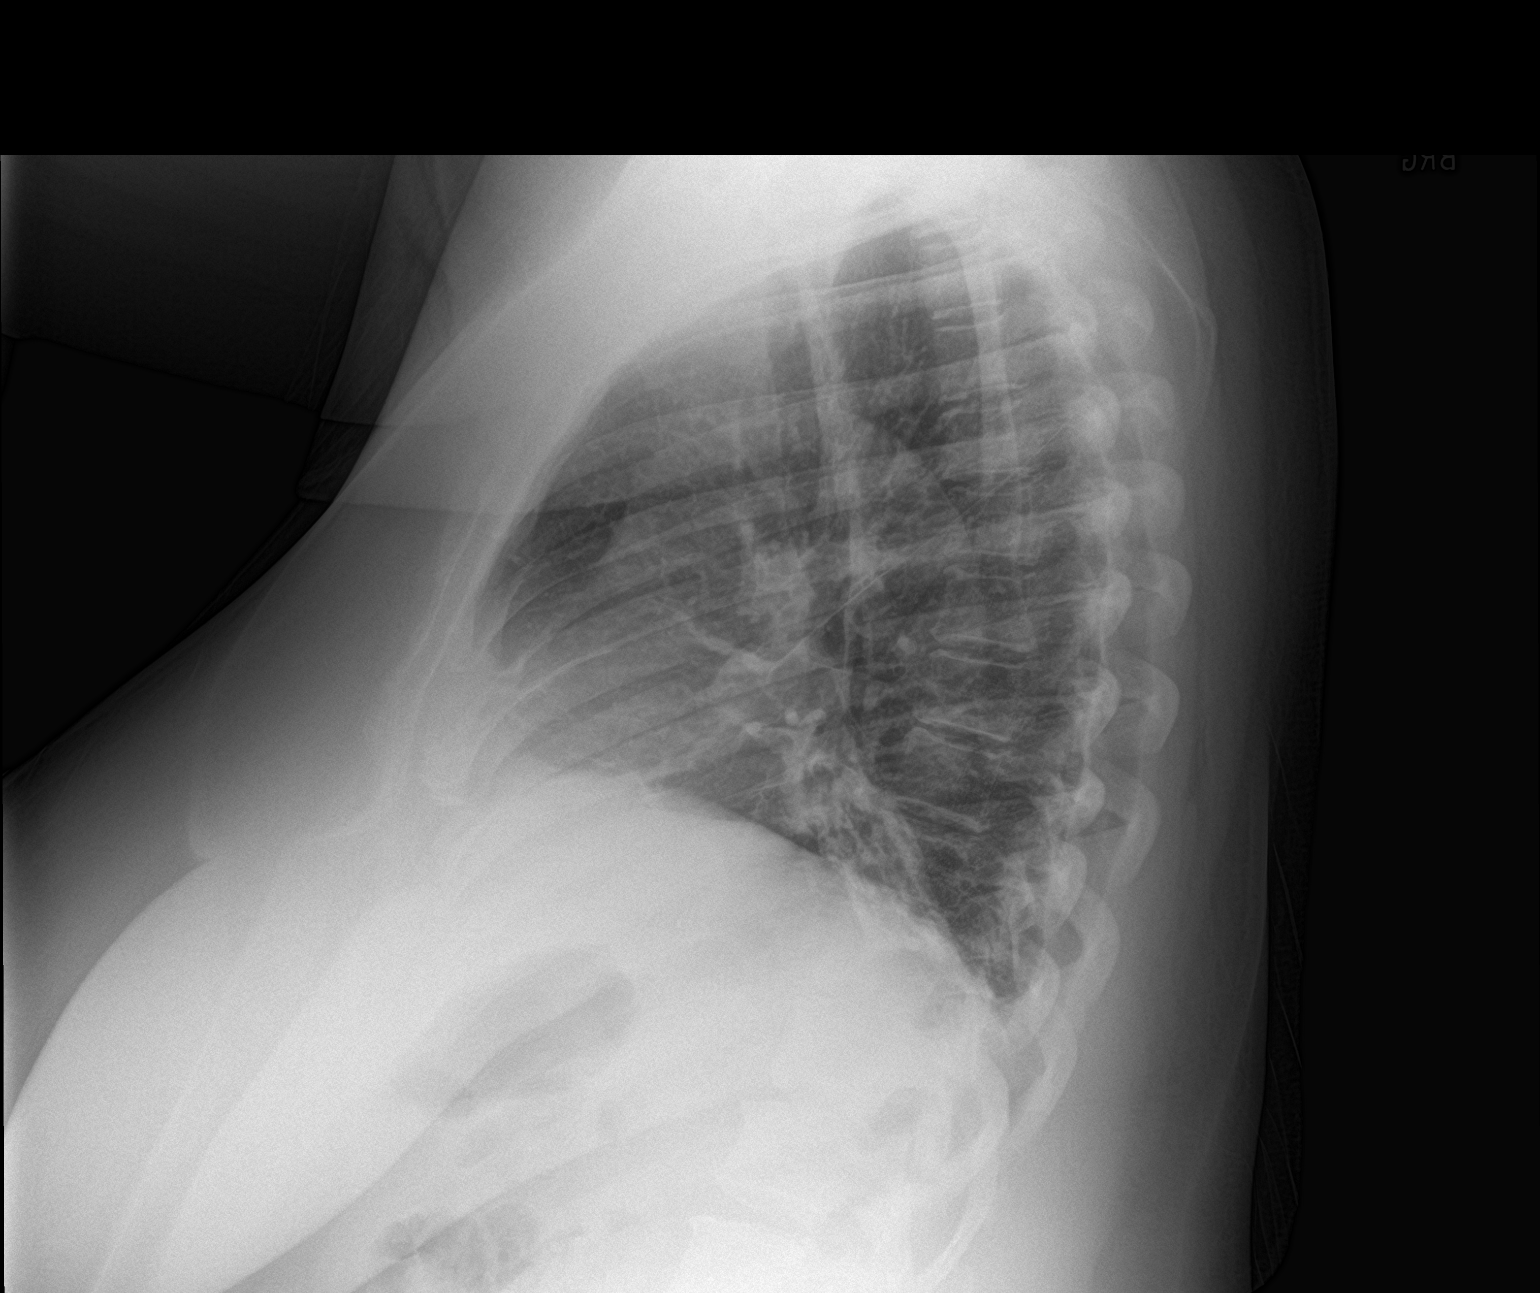

[2 of 2 positions shown; findings below may reference images not displayed]

FINDINGS: Chronic scarring or atelectasis at the bases. No consolidation or
effusion. Normal cardiomediastinal silhouette contour. No
pneumothorax.
IMPRESSION: No active cardiopulmonary disease. Scarring or atelectasis at the
lung bases
